# Patient Record
Sex: Male | Born: 1955 | Race: White | Hispanic: No | Marital: Married | Smoking: Current every day smoker
Health system: Southern US, Community
[De-identification: ages and names within clinical notes are randomized; demographics above are authoritative.]

## PROBLEM LIST (undated history)

## (undated) DIAGNOSIS — I639 Cerebral infarction, unspecified: Secondary | ICD-10-CM

## (undated) DIAGNOSIS — M1990 Unspecified osteoarthritis, unspecified site: Secondary | ICD-10-CM

## (undated) DIAGNOSIS — I675 Moyamoya disease: Secondary | ICD-10-CM

## (undated) DIAGNOSIS — G709 Myoneural disorder, unspecified: Secondary | ICD-10-CM

## (undated) DIAGNOSIS — T7840XA Allergy, unspecified, initial encounter: Secondary | ICD-10-CM

## (undated) HISTORY — DX: Myoneural disorder, unspecified: G70.9

## (undated) HISTORY — DX: Unspecified osteoarthritis, unspecified site: M19.90

## (undated) HISTORY — PX: CERVICAL DISC SURGERY: SHX588

## (undated) HISTORY — DX: Allergy, unspecified, initial encounter: T78.40XA

## (undated) HISTORY — DX: Cerebral infarction, unspecified: I63.9

## (undated) HISTORY — PX: VASECTOMY: SHX75

---

## 2002-08-01 ENCOUNTER — Ambulatory Visit (HOSPITAL_COMMUNITY): Admission: RE | Admit: 2002-08-01 | Discharge: 2002-08-01 | Payer: Self-pay | Admitting: Obstetrics and Gynecology

## 2002-08-01 ENCOUNTER — Encounter: Payer: Self-pay | Admitting: Obstetrics and Gynecology

## 2002-08-07 ENCOUNTER — Observation Stay (HOSPITAL_COMMUNITY): Admission: RE | Admit: 2002-08-07 | Discharge: 2002-08-08 | Payer: Self-pay | Admitting: Neurosurgery

## 2002-08-07 ENCOUNTER — Encounter: Payer: Self-pay | Admitting: Neurosurgery

## 2005-12-21 ENCOUNTER — Ambulatory Visit (HOSPITAL_COMMUNITY): Admission: RE | Admit: 2005-12-21 | Discharge: 2005-12-21 | Payer: Self-pay | Admitting: General Surgery

## 2005-12-26 ENCOUNTER — Ambulatory Visit (HOSPITAL_COMMUNITY): Admission: RE | Admit: 2005-12-26 | Discharge: 2005-12-26 | Payer: Self-pay | Admitting: General Surgery

## 2008-12-08 ENCOUNTER — Ambulatory Visit (HOSPITAL_COMMUNITY): Admission: RE | Admit: 2008-12-08 | Discharge: 2008-12-08 | Payer: Self-pay | Admitting: General Surgery

## 2009-05-18 ENCOUNTER — Ambulatory Visit (HOSPITAL_COMMUNITY): Admission: RE | Admit: 2009-05-18 | Discharge: 2009-05-18 | Payer: Self-pay | Admitting: General Surgery

## 2010-12-10 NOTE — H&P (Signed)
NAME:  James Marsh, James Marsh                       ACCOUNT NO.:  [id number]   MEDICAL RECORD NO.:  [id number]                   PATIENT TYPE:  INP   LOCATION:  3011                                 FACILITY:  [facility]   PHYSICIAN:  Hilda Lias, M.D.                DATE OF BIRTH:  1956-06-16   DATE OF ADMISSION:  08/07/2002  DATE OF DISCHARGE:                                HISTORY & PHYSICAL   HISTORY OF PRESENT ILLNESS:  The patient is a gentleman who was seen in my  office two days ago because, since February 2003, he had been having some  neck pain which was getting worse to the point that now it is going into the  left arm, he cannot sleep, has weakness and he is not able to work.  He is  quite miserable.  The patient denies any problems with the right upper  extremity.  This gentleman, in the past had __________ treatment without  improvement and now he is getting worse.  He states he had an MRI and was  sent to [place] for evaluation.   PAST MEDICAL HISTORY:  Negative for surgery.  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   ALLERGIES:  PENICILLIN.   CURRENT MEDICATIONS:  At the present time, he takes aspirin.   SOCIAL HISTORY:  The patient smokes cigarettes. He does not drink.   REVIEW OF SYMPTOMS:  Positive for neck and arm pain.   PHYSICAL EXAMINATION:  HEENT:  Normal.  NECK:  He came into my office with his left hand on top of the head.  He is  able to flex his head but extension causes pain.  LUNGS:  There are some rhonchi bilaterally.  HEART:  Heart sounds normal.  EXTREMITIES:  Normal with normal pulses.  NEUROLOGICAL:  Mental status is normal. Cranial nerves II-XII normal.  Strength is 5/5 except in the left triceps where he has 1/5 weakness with  hypertonia of the left pectoralis major.  Sensation:  He complains of some  frost bite in the left index finger.  Coordination is  normal.   LABORATORY DATA:  MRI showed that he has a herniated disc at the level of C6-  C7 centered to the left with extension laterally. He has a mild central disc  at the level C5-C6 and C6-C7.   IMPRESSION:  Left C6-C7 radiculopathy secondary to herniated disc with  lateral extension.  A small C5-C6 and C6-C7 spondylosis is near this.    PLAN:  The patient has been admitted for surgery.  Because of his moyamoya  and because it is lateral, we are going to use a posterior approach.  The  patient and I went over the risks which include infection, CSF leak,  bleeding because of his history of aspirin, more important was, however, a  stroke and all the damage from his cerebral infarction of the brain.  The  patient declined more opinion.                                               Hilda Lias, M.D.    EB/MEDQ  D:  08/07/2002  T:  08/07/2002  Job:  [id number]

## 2010-12-10 NOTE — Op Note (Signed)
NAME:  James Marsh, James Marsh                       ACCOUNT NO.:  [id number]   MEDICAL RECORD NO.:  [id number]                   PATIENT TYPE:  INP   LOCATION:  3011                                 FACILITY:  [facility]   PHYSICIAN:  Hilda Lias, M.D.                DATE OF BIRTH:  02-23-1956   DATE OF PROCEDURE:  08/08/2002  DATE OF DISCHARGE:                                 OPERATIVE REPORT   PREOPERATIVE DIAGNOSES:  1. C6-7 herniated disk with C7 radiculopathy.  2. 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  3. History of moyamoya disease.   POSTOPERATIVE DIAGNOSES:  1. C6-7 herniated disk with C7 radiculopathy.  2. 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  3. History of moyamoya disease.   PROCEDURE:  Left C5-6 diskectomy, sitting manner, using the Met-RX system, C-  arm, and microscope.   SURGEON:  Hilda Lias, M.D.   ASSISTANT:  Danae Orleans. Venetia Maxon, M.D.   CLINICAL HISTORY:  The patient is being admitted because of neck and left  upper extremity pain associated with a weakness of the triceps up to the  point that it is a 1/6 with atrophy of the pectoralis major.  X-ray showed  that he has a herniated disk laterally at the level of C6-7.  He also has  some changes of 5-6 and 4-5.  Because of the history of vascular disease of  the brain and because of the location of the disk, we decided to go a head  with a posterior approach.   DESCRIPTION OF PROCEDURE:  The patient was taken to the OR and after  intubation he was positioned in a sitting manner.  A Doppler was applied to  the chest wall.  The three-pin head holder was applied to the head.  The  neck was prepped with Betadine.  With the C-arm we localized the area  between C6-7.  The incision was made in the skin through the subcutaneous  tissue, and a dilator was inserted until we were able to be at the level of  the C6-7 laterally.  With the drill we drilled the lower lamina of C6, upper  of C7, and part of the facet.  The yellow ligament was also  excised.  Indeed, we found that the C7 nerve root was swollen and reddish and right at  the level of the axilla, there was a large disk.  The incision was done, and  about four free fragments of disk were removed.  Investigation superiorly,  inferiorly, and medially was negative.  Foraminotomy was accomplished.  Having done this, the area was irrigated and Depo-Medrol was left in the  dural space and the wound was closed with Vicryl and Steri-Strip.  Hilda Lias, M.D.    EB/MEDQ  D:  08/07/2002  T:  08/08/2002  Job:  [id number]

## 2015-08-27 DIAGNOSIS — D485 Neoplasm of uncertain behavior of skin: Secondary | ICD-10-CM | POA: Diagnosis not present

## 2015-08-27 DIAGNOSIS — D225 Melanocytic nevi of trunk: Secondary | ICD-10-CM | POA: Diagnosis not present

## 2015-08-27 DIAGNOSIS — D2260 Melanocytic nevi of unspecified upper limb, including shoulder: Secondary | ICD-10-CM | POA: Diagnosis not present

## 2015-08-27 DIAGNOSIS — L820 Inflamed seborrheic keratosis: Secondary | ICD-10-CM | POA: Diagnosis not present

## 2015-09-07 DIAGNOSIS — L219 Seborrheic dermatitis, unspecified: Secondary | ICD-10-CM | POA: Diagnosis not present

## 2016-08-17 DIAGNOSIS — B009 Herpesviral infection, unspecified: Secondary | ICD-10-CM | POA: Diagnosis not present

## 2016-08-17 DIAGNOSIS — D225 Melanocytic nevi of trunk: Secondary | ICD-10-CM | POA: Diagnosis not present

## 2017-01-09 ENCOUNTER — Ambulatory Visit (INDEPENDENT_AMBULATORY_CARE_PROVIDER_SITE_OTHER): Payer: Medicare Other | Admitting: Family Medicine

## 2017-01-09 ENCOUNTER — Encounter: Payer: Self-pay | Admitting: Family Medicine

## 2017-01-09 DIAGNOSIS — Z9109 Other allergy status, other than to drugs and biological substances: Secondary | ICD-10-CM | POA: Diagnosis not present

## 2017-01-09 DIAGNOSIS — Z8673 Personal history of transient ischemic attack (TIA), and cerebral infarction without residual deficits: Secondary | ICD-10-CM | POA: Diagnosis not present

## 2017-01-09 DIAGNOSIS — Z91018 Allergy to other foods: Secondary | ICD-10-CM | POA: Diagnosis not present

## 2017-01-09 DIAGNOSIS — K429 Umbilical hernia without obstruction or gangrene: Secondary | ICD-10-CM | POA: Diagnosis not present

## 2017-01-09 DIAGNOSIS — I675 Moyamoya disease: Secondary | ICD-10-CM | POA: Diagnosis not present

## 2017-01-09 DIAGNOSIS — M5000 Cervical disc disorder with myelopathy, unspecified cervical region: Secondary | ICD-10-CM | POA: Diagnosis not present

## 2017-01-09 DIAGNOSIS — R51 Headache: Secondary | ICD-10-CM

## 2017-01-09 DIAGNOSIS — R519 Headache, unspecified: Secondary | ICD-10-CM | POA: Insufficient documentation

## 2017-01-09 LAB — CBC
HEMATOCRIT: 40 % (ref 38.5–50.0)
Hemoglobin: 13.5 g/dL (ref 13.2–17.1)
MCH: 30 pg (ref 27.0–33.0)
MCHC: 33.8 g/dL (ref 32.0–36.0)
MCV: 88.9 fL (ref 80.0–100.0)
MPV: 10 fL (ref 7.5–12.5)
PLATELETS: 233 10*3/uL (ref 140–400)
RBC: 4.5 MIL/uL (ref 4.20–5.80)
RDW: 15.4 % — ABNORMAL HIGH (ref 11.0–15.0)
WBC: 6.2 10*3/uL (ref 3.8–10.8)

## 2017-01-09 LAB — COMPLETE METABOLIC PANEL WITH GFR
ALK PHOS: 86 U/L (ref 40–115)
ALT: 9 U/L (ref 9–46)
AST: 14 U/L (ref 10–35)
Albumin: 4.1 g/dL (ref 3.6–5.1)
BILIRUBIN TOTAL: 0.7 mg/dL (ref 0.2–1.2)
BUN: 17 mg/dL (ref 7–25)
CALCIUM: 9 mg/dL (ref 8.6–10.3)
CO2: 29 mmol/L (ref 20–31)
CREATININE: 1.03 mg/dL (ref 0.70–1.25)
Chloride: 104 mmol/L (ref 98–110)
GFR, EST NON AFRICAN AMERICAN: 77 mL/min (ref >=60)
Glucose, Bld: 80 mg/dL (ref 65–99)
Potassium: 4.4 mmol/L (ref 3.5–5.3)
Sodium: 141 mmol/L (ref 135–146)
TOTAL PROTEIN: 6.8 g/dL (ref 6.1–8.1)

## 2017-01-09 LAB — LIPID PANEL
CHOLESTEROL: 182 mg/dL (ref <200)
HDL: 43 mg/dL (ref >40)
LDL CALC: 125 mg/dL — AB (ref <100)
TRIGLYCERIDES: 70 mg/dL (ref <150)
Total CHOL/HDL Ratio: 4.2 Ratio (ref <5.0)
VLDL: 14 mg/dL (ref <30)

## 2017-01-09 NOTE — Progress Notes (Signed)
Chief Complaint  Patient presents with  . 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 Takes one aspirin every other day No other prescription medicines Has cervical and lumbar disc disease and had a cervical surgery for HNP Has muscle atrophy in right side and leg from "nerve damage" Has chronic daily headache he presumes is from his Moyamoya Has developed as an adult allergies to multiple foods including milk/cheese and nuts and ?wheat Also pollen allergies and current PND with ear pressure and cough Tries to eat well and exercise Smokes a pipe "to relax" No recent medical care or labs No colonoscopy - does not think it needed Discussed immunizations - which he generally avoids   Patient Active Problem List   Diagnosis Date Noted  . Moyamoya syndrome 01/09/2017  . Umbilical hernia 78/46/9629  . Cervical disc disease with myelopathy 01/09/2017  . History of completed stroke 01/09/2017  . Chronic daily headache 01/09/2017  . Environmental allergies 01/09/2017  . Food allergy 01/09/2017    Outpatient Encounter Prescriptions as of 01/09/2017  Medication Sig  . aspirin 325 MG EC tablet Take 325 mg by mouth daily. Takes every other day  . b complex vitamins tablet Take 1 tablet by mouth daily.  . vitamin C (ASCORBIC ACID) 500 MG tablet Take 500 mg by mouth daily.  . vitamin E 400 UNIT capsule Take 400 Units by mouth daily.  . [DISCONTINUED] aspirin EC 81 MG tablet Take 81 mg by mouth daily.   No facility-administered encounter medications on file as of 01/09/2017.     Past Medical History:  Diagnosis Date  . Allergy    pollen, foods  . Arthritis   . Neuromuscular disorder ([place])   . Stroke [facility])     Past Surgical History:  Procedure Laterality Date  . CERVICAL DISC SURGERY    . VASECTOMY      Social History   Social History  . Marital status: Married    Spouse name: N/A  . Number of children: N/A  . Years of  education: N/A   Occupational History  . Not on file.   Social History Main Topics  . Smoking status: Current Every Day Smoker    Types: Pipe    Start date: 07/25/1968  . Smokeless tobacco: Never Used     Comment: pipe smoker daily 3-4 times a day  . Alcohol use No  . Drug use: No  . Sexual activity: Not on file   Other Topics Concern  . Not on file   Social History Narrative  . No narrative on file    Family History  Problem Relation Age of Onset  . Cancer Mother 21       ovarian  . Heart disease Father   . Alcohol abuse Father   . Stroke Father   . Cancer Sister        brain  . Cancer Brother        brain  . Cancer Brother        brain  . Heart disease Brother     Review of Systems  Constitutional: Negative for chills, fever and weight loss.  HENT: Positive for congestion, ear pain and sinus pain. Negative for hearing loss.   Eyes: Negative for blurred vision, pain and redness.  Respiratory: Positive for cough. Negative for sputum production and shortness of breath.   Cardiovascular: Negative for chest pain, palpitations and leg swelling.  Gastrointestinal: Negative for abdominal pain, constipation, diarrhea and heartburn.  Genitourinary: Negative for dysuria and frequency.  Musculoskeletal: Positive for back pain and neck pain. Negative for falls, joint pain and myalgias.  Neurological: Positive for focal weakness and headaches. Negative for dizziness and seizures.  Endo/Heme/Allergies: Positive for environmental allergies. Bruises/bleeds easily.  Psychiatric/Behavioral: Negative for depression. The patient is not nervous/anxious and does not have insomnia.     BP 124/64 (BP Location: Right Arm, Patient Position: Sitting, Cuff Size: Normal)   Pulse 72   Temp 98.7 F (37.1 C) (Temporal)   Resp 18   Ht 5\' 6"  (1.676 m)   Wt 134 lb 0.6 oz (60.8 kg)   SpO2 98%   BMI 21.63 kg/m   Physical Exam  Constitutional: He is oriented to person, place, and time. He  appears well-developed and well-nourished.  thin  HENT:  Head: Normocephalic and atraumatic.  Mouth/Throat: Oropharynx is clear and moist.  Nose congested, clear drainage, both TMs are dull  Eyes: Conjunctivae are normal. Pupils are equal, round, and reactive to light.  Neck: Normal range of motion. Neck supple. No thyromegaly present.  Cardiovascular: Normal rate, regular rhythm and normal heart sounds.   Pulmonary/Chest: Effort normal and breath sounds normal. No respiratory distress.  Musculoskeletal: Normal range of motion. He exhibits no edema.  Right quad has atrophy  Lymphadenopathy:    He has cervical adenopathy.  Neurological: He is alert and oriented to person, place, and time.  Skin: Skin is warm and dry.  Psychiatric: He has a normal mood and affect. His behavior is normal. Thought content normal.  Mild emotional lability  Nursing note and vitals reviewed. ASSESSMENT/PLAN:   1. Moyamoya syndrome - CBC - COMPLETE METABOLIC PANEL WITH GFR - Lipid panel - Urinalysis, Routine w reflex microscopic  2. Umbilical hernia without obstruction and without gangrene  3. Cervical disc disease with myelopathy  4. History of completed stroke  5. Chronic daily headache  6. Environmental allergies Discussed OTC medicines for allergies  7. Food allergy avoid   Patient Instructions  Consider saline rinses of sinuses Cetirizine or loratadine for the allergies A steroid nasal spray may help (flonase or nasacort)  Labs ordered I will send you a letter with your test results.  If there is anything of concern, we will call right away.  See me yearly  Consider pneumonia and shingles shots These may be obtained at the pharmacy    Raylene Everts, MD

## 2017-01-09 NOTE — Patient Instructions (Signed)
Consider saline rinses of sinuses Cetirizine or loratadine for the allergies A steroid nasal spray may help (flonase or nasacort)  Labs ordered I will send you a letter with your test results.  If there is anything of concern, we will call right away.  See me yearly  Consider pneumonia and shingles shots These may be obtained at the pharmacy

## 2017-01-10 ENCOUNTER — Encounter: Payer: Self-pay | Admitting: Family Medicine

## 2017-01-10 LAB — URINALYSIS, ROUTINE W REFLEX MICROSCOPIC
BILIRUBIN URINE: NEGATIVE
GLUCOSE, UA: NEGATIVE
Hgb urine dipstick: NEGATIVE
LEUKOCYTES UA: NEGATIVE
Nitrite: NEGATIVE
PH: 6 (ref 5.0–8.0)
PROTEIN: NEGATIVE
SPECIFIC GRAVITY, URINE: 1.025 (ref 1.001–1.035)

## 2017-01-12 ENCOUNTER — Telehealth: Payer: Self-pay | Admitting: Family Medicine

## 2017-01-12 DIAGNOSIS — I675 Moyamoya disease: Secondary | ICD-10-CM

## 2017-01-12 NOTE — Telephone Encounter (Signed)
done

## 2017-01-12 NOTE — Telephone Encounter (Signed)
Cb#: [phone] (wife- linda santell )   Patients wife called in stating patient told her he was experiencing numbness around his mouth, in hands, and on the left side of his leg (didnt specify which leg) She says he has a rare medical issue that requires a lot of attention, so she is requesting an mri (if dr.nelson agrees that maybe its needed for these symptoms) Also a referral to [place] at [facility] Please call her back to discuss.

## 2017-01-12 NOTE — Telephone Encounter (Signed)
Left message to call office

## 2017-01-12 NOTE — Telephone Encounter (Signed)
Spoke to [employer], she states she does not feel he is having stroke sx, but is having left arm numbness and lip numbness. Does not feel he needs to go to ER, just want referral to neurology.

## 2017-01-12 NOTE — Telephone Encounter (Signed)
Can refer to neurologist.  Cannot order a MRI and get urgently.  If he is having acute symptoms that even remotely could be a stroke he needs to go to an ER for urgent testing.

## 2017-01-13 ENCOUNTER — Telehealth: Payer: Self-pay

## 2017-01-13 NOTE — Telephone Encounter (Signed)
Please call regarding lab results - please call home # [phone]

## 2017-01-16 NOTE — Telephone Encounter (Signed)
Spoke with pt about labs, letter also mailed.

## 2017-01-18 ENCOUNTER — Emergency Department (HOSPITAL_COMMUNITY): Payer: Medicare Other

## 2017-01-18 ENCOUNTER — Encounter (HOSPITAL_COMMUNITY): Payer: Self-pay | Admitting: Emergency Medicine

## 2017-01-18 ENCOUNTER — Inpatient Hospital Stay (HOSPITAL_COMMUNITY)
Admission: EM | Admit: 2017-01-18 | Discharge: 2017-01-20 | DRG: 065 | Disposition: A | Discharge status: Home / Self Care | Payer: Medicare Other | Attending: Internal Medicine | Admitting: Internal Medicine

## 2017-01-18 DIAGNOSIS — I6522 Occlusion and stenosis of left carotid artery: Secondary | ICD-10-CM | POA: Diagnosis not present

## 2017-01-18 DIAGNOSIS — Z8673 Personal history of transient ischemic attack (TIA), and cerebral infarction without residual deficits: Secondary | ICD-10-CM | POA: Diagnosis not present

## 2017-01-18 DIAGNOSIS — I10 Essential (primary) hypertension: Secondary | ICD-10-CM | POA: Diagnosis present

## 2017-01-18 DIAGNOSIS — I639 Cerebral infarction, unspecified: Principal | ICD-10-CM | POA: Diagnosis present

## 2017-01-18 DIAGNOSIS — Z8249 Family history of ischemic heart disease and other diseases of the circulatory system: Secondary | ICD-10-CM

## 2017-01-18 DIAGNOSIS — E785 Hyperlipidemia, unspecified: Secondary | ICD-10-CM | POA: Diagnosis present

## 2017-01-18 DIAGNOSIS — R51 Headache: Secondary | ICD-10-CM

## 2017-01-18 DIAGNOSIS — Z880 Allergy status to penicillin: Secondary | ICD-10-CM

## 2017-01-18 DIAGNOSIS — F17290 Nicotine dependence, other tobacco product, uncomplicated: Secondary | ICD-10-CM | POA: Diagnosis present

## 2017-01-18 DIAGNOSIS — R200 Anesthesia of skin: Secondary | ICD-10-CM | POA: Diagnosis not present

## 2017-01-18 DIAGNOSIS — I651 Occlusion and stenosis of basilar artery: Secondary | ICD-10-CM | POA: Diagnosis present

## 2017-01-18 DIAGNOSIS — I675 Moyamoya disease: Secondary | ICD-10-CM | POA: Diagnosis present

## 2017-01-18 DIAGNOSIS — Z7982 Long term (current) use of aspirin: Secondary | ICD-10-CM

## 2017-01-18 DIAGNOSIS — R519 Headache, unspecified: Secondary | ICD-10-CM | POA: Diagnosis present

## 2017-01-18 DIAGNOSIS — Z823 Family history of stroke: Secondary | ICD-10-CM

## 2017-01-18 DIAGNOSIS — Z981 Arthrodesis status: Secondary | ICD-10-CM

## 2017-01-18 DIAGNOSIS — Z79899 Other long term (current) drug therapy: Secondary | ICD-10-CM

## 2017-01-18 HISTORY — DX: Cerebral infarction, unspecified: I63.9

## 2017-01-18 HISTORY — DX: Moyamoya disease: I67.5

## 2017-01-18 LAB — DIFFERENTIAL
BASOS PCT: 0 %
Basophils Absolute: 0 10*3/uL (ref 0.0–0.1)
EOS ABS: 0.1 10*3/uL (ref 0.0–0.7)
Eosinophils Relative: 3 %
Lymphocytes Relative: 39 %
Lymphs Abs: 2.2 10*3/uL (ref 0.7–4.0)
MONO ABS: 0.3 10*3/uL (ref 0.1–1.0)
MONOS PCT: 6 %
NEUTROS ABS: 3 10*3/uL (ref 1.7–7.7)
Neutrophils Relative %: 52 %

## 2017-01-18 LAB — COMPREHENSIVE METABOLIC PANEL
ALT: 13 U/L — AB (ref 17–63)
AST: 18 U/L (ref 15–41)
Albumin: 4.3 g/dL (ref 3.5–5.0)
Alkaline Phosphatase: 81 U/L (ref 38–126)
Anion gap: 11 (ref 5–15)
BILIRUBIN TOTAL: 0.7 mg/dL (ref 0.3–1.2)
BUN: 15 mg/dL (ref 6–20)
CHLORIDE: 100 mmol/L — AB (ref 101–111)
CO2: 27 mmol/L (ref 22–32)
CREATININE: 0.97 mg/dL (ref 0.61–1.24)
Calcium: 9.1 mg/dL (ref 8.9–10.3)
Glucose, Bld: 85 mg/dL (ref 65–99)
Potassium: 3.8 mmol/L (ref 3.5–5.1)
Sodium: 138 mmol/L (ref 135–145)
TOTAL PROTEIN: 7.6 g/dL (ref 6.5–8.1)

## 2017-01-18 LAB — PROTIME-INR
INR: 1.02
Prothrombin Time: 13.4 seconds (ref 11.4–15.2)

## 2017-01-18 LAB — RAPID URINE DRUG SCREEN, HOSP PERFORMED
Amphetamines: NOT DETECTED
BENZODIAZEPINES: NOT DETECTED
Barbiturates: NOT DETECTED
Cocaine: NOT DETECTED
Opiates: NOT DETECTED
Tetrahydrocannabinol: NOT DETECTED

## 2017-01-18 LAB — CBC
HEMATOCRIT: 42.6 % (ref 39.0–52.0)
Hemoglobin: 14.4 g/dL (ref 13.0–17.0)
MCH: 30.1 pg (ref 26.0–34.0)
MCHC: 33.8 g/dL (ref 30.0–36.0)
MCV: 89.1 fL (ref 78.0–100.0)
Platelets: 242 10*3/uL (ref 150–400)
RBC: 4.78 MIL/uL (ref 4.22–5.81)
RDW: 13.9 % (ref 11.5–15.5)
WBC: 5.7 10*3/uL (ref 4.0–10.5)

## 2017-01-18 LAB — I-STAT CHEM 8, ED
BUN: 15 mg/dL (ref 6–20)
CALCIUM ION: 1.15 mmol/L (ref 1.15–1.40)
Chloride: 103 mmol/L (ref 101–111)
Creatinine, Ser: 1 mg/dL (ref 0.61–1.24)
GLUCOSE: 82 mg/dL (ref 65–99)
HCT: 36 % — ABNORMAL LOW (ref 39.0–52.0)
HEMOGLOBIN: 12.2 g/dL — AB (ref 13.0–17.0)
Potassium: 3.9 mmol/L (ref 3.5–5.1)
Sodium: 141 mmol/L (ref 135–145)
TCO2: 27 mmol/L (ref 0–100)

## 2017-01-18 LAB — I-STAT TROPONIN, ED: TROPONIN I, POC: 0.01 ng/mL (ref 0.00–0.08)

## 2017-01-18 LAB — URINALYSIS, ROUTINE W REFLEX MICROSCOPIC
Bilirubin Urine: NEGATIVE
Glucose, UA: NEGATIVE mg/dL
HGB URINE DIPSTICK: NEGATIVE
Ketones, ur: NEGATIVE mg/dL
LEUKOCYTES UA: NEGATIVE
NITRITE: NEGATIVE
PROTEIN: NEGATIVE mg/dL
SPECIFIC GRAVITY, URINE: 1.009 (ref 1.005–1.030)
pH: 7 (ref 5.0–8.0)

## 2017-01-18 LAB — APTT: aPTT: 34 seconds (ref 24–36)

## 2017-01-18 LAB — ETHANOL

## 2017-01-18 MED ORDER — ACETAMINOPHEN 650 MG RE SUPP: 650.0000 mg | RECTAL | Status: DC | PRN

## 2017-01-18 MED ORDER — B COMPLEX-C PO TABS
1.0000 | ORAL_TABLET | Freq: Every day | ORAL | Status: DC
  Filled 2017-01-18: qty 1

## 2017-01-18 MED ORDER — ASPIRIN 300 MG RE SUPP
300.0000 mg | Freq: Every day | RECTAL | Status: DC
  Filled 2017-01-18: qty 1

## 2017-01-18 MED ORDER — ACETAMINOPHEN 160 MG/5ML PO SOLN: 650.0000 mg | ORAL | Status: DC | PRN

## 2017-01-18 MED ORDER — ACETAMINOPHEN 325 MG PO TABS: 650.0000 mg | ORAL_TABLET | ORAL | Status: DC | PRN

## 2017-01-18 MED ORDER — STROKE: EARLY STAGES OF RECOVERY BOOK
Freq: Once | Status: DC
  Filled 2017-01-18: qty 1

## 2017-01-18 MED ORDER — SODIUM CHLORIDE 0.9 % IV SOLN
INTRAVENOUS | Status: DC
  Administered 2017-01-19: 05:00:00 via INTRAVENOUS

## 2017-01-18 MED ORDER — FLUTICASONE PROPIONATE 50 MCG/ACT NA SUSP: 1.0000 | Freq: Every day | NASAL | Status: DC | PRN

## 2017-01-18 MED ORDER — ENOXAPARIN SODIUM 40 MG/0.4ML ~~LOC~~ SOLN
40.0000 mg | SUBCUTANEOUS | Status: DC
  Filled 2017-01-18: qty 0.4

## 2017-01-18 MED ORDER — ASPIRIN 325 MG PO TABS
325.0000 mg | ORAL_TABLET | Freq: Every day | ORAL | Status: DC
  Administered 2017-01-19 – 2017-01-20 (×2): 325 mg via ORAL
  Filled 2017-01-18 (×2): qty 1

## 2017-01-18 MED ORDER — VITAMIN C 500 MG PO TABS
500.0000 mg | ORAL_TABLET | Freq: Every day | ORAL | Status: DC
  Filled 2017-01-18: qty 1

## 2017-01-18 MED ORDER — SENNOSIDES-DOCUSATE SODIUM 8.6-50 MG PO TABS
1.0000 | ORAL_TABLET | Freq: Every evening | ORAL | Status: DC | PRN
Start: 2017-01-18 — End: 2017-01-20

## 2017-01-18 NOTE — ED Triage Notes (Signed)
Pt a/o. States has had several episodes of tingling and numbess around mouth and left arm. States will last approx few minutes, will go away and come back later. C/o headache. Steady gait.

## 2017-01-18 NOTE — ED Notes (Signed)
Pt originally reported that he was not having any symptoms at time of registration to nurse first and during start of triage. Pt then reported numbness was starting around his lips while triage RN was performing NIH. Pt denied any unilateral weakness or numbness at time of triage. Strength equal bilaterally, no arm droop, face symmetrical. Dr. Oleta Mouse notified of pt's symptoms. No Code Stroke at this time. Will continue to monitor. Pt placed in waiting area behind triage room.

## 2017-01-18 NOTE — H&P (Signed)
History and Physical    James Marsh HGD:[id number] DOB: 25-Nov-1955 DOA: 01/18/2017  PCP: Raylene Everts, MD  Patient coming from: Home.   Chief Complaint:  Numbness of left upper extremity and circumoral paresthesia.   HPI: James Marsh is an 61 y.o. male with hx of Moya moya disease, diagnosed with both MRA and cerebral arteriogram in the 90's, hx of on going pipe tobacco use, complete occlusion of the left ICA, chronic daily HA, hx of cervical disk disease, prior CVA, presented to the ER with several day hx of intermittent circumoral paresthesia and left upper ext weakness.  While head CT was negative, MRI of the brain suggest acute CVAs, with several area on both hemispheres.  See MRI report.  Serology was unremarkable, and EKG showed NSR.     ED Course:  See above.  Rewiew of Systems:  Constitutional: Negative for malaise, fever and chills. No significant weight loss or weight gain Eyes: Negative for eye pain, redness and discharge, diplopia, visual changes, or flashes of light. ENMT: Negative for ear pain, hoarseness, nasal congestion, sinus pressure and sore throat. No headaches; tinnitus, drooling, or problem swallowing. Cardiovascular: Negative for chest pain, palpitations, diaphoresis, dyspnea and peripheral edema. ; No orthopnea, PND Respiratory: Negative for cough, hemoptysis, wheezing and stridor. No pleuritic chestpain. Gastrointestinal: Negative for diarrhea, constipation,  melena, blood in stool, hematemesis, jaundice and rectal bleeding.    Genitourinary: Negative for frequency, dysuria, incontinence,flank pain and hematuria; Musculoskeletal: Negative for back pain and neck pain. Negative for swelling and trauma.;  Skin: . Negative for pruritus, rash, abrasions, bruising and skin lesion.; ulcerations Neuro: Negative for headache, lightheadedness and neck stiffness. Negative for weakness, altered level of consciousness , altered mental status, extremity weakness,  burning feet, involuntary movement, seizure and syncope.  Psych: negative for anxiety, depression, insomnia, tearfulness, panic attacks, hallucinations, paranoia, suicidal or homicidal ideation    Past Medical History:  Diagnosis Date  . Allergy    pollen, foods  . Arthritis   . Moya moya disease   . Neuromuscular disorder ([place])   . Stroke [facility])    Past Surgical History:  Procedure Laterality Date  . CERVICAL DISC SURGERY    . VASECTOMY       reports that he has been smoking Pipe.  He started smoking about 48 years ago. He has never used smokeless tobacco. He reports that he does not drink alcohol or use drugs.  Allergies  Allergen Reactions  . Penicillins Shortness Of Breath    Has patient had a PCN reaction causing immediate rash, facial/tongue/throat swelling, SOB or lightheadedness with hypotension: yes Has patient had a PCN reaction causing severe rash involving mucus membranes or skin necrosis: no Has patient had a PCN reaction that required hospitalization: yes Has patient had a PCN reaction occurring within the last 10 years: no If all of the above answers are "NO", then may proceed with Cephalosporin use.     Family History  Problem Relation Age of Onset  . Cancer Mother 29       ovarian  . Heart disease Father   . Alcohol abuse Father   . Stroke Father   . Cancer Sister        brain  . Cancer Brother        brain  . Cancer Brother        brain  . Heart disease Brother      Prior to Admission medications   Medication Sig Start Date End  Date Taking? Authorizing Provider  aspirin 325 MG EC tablet Take 325 mg by mouth every other day.    Yes [provider]  b complex vitamins tablet Take 1 tablet by mouth daily.   Yes [provider]  fluticasone (FLONASE) 50 MCG/ACT nasal spray Place 1 spray into both nostrils daily as needed for allergies or rhinitis.   Yes [provider]  vitamin C (ASCORBIC ACID) 500 MG tablet Take 500 mg by  mouth daily.   Yes [provider]  vitamin E 400 UNIT capsule Take 400 Units by mouth daily.   Yes [provider]    Physical Exam: Vitals:   01/18/17 1800 01/18/17 1830 01/18/17 1900 01/18/17 2131  BP: (!) 150/77 (!) 154/84  (!) 154/80  Pulse: 63 (!) 56  63  Resp:  18  17  Temp:   97.8 F (36.6 C)   TempSrc:      SpO2: 99% 98%  97%  Weight:      Height:       Constitutional: NAD, calm, comfortable Vitals:   01/18/17 1800 01/18/17 1830 01/18/17 1900 01/18/17 2131  BP: (!) 150/77 (!) 154/84  (!) 154/80  Pulse: 63 (!) 56  63  Resp:  18  17  Temp:   97.8 F (36.6 C)   TempSrc:      SpO2: 99% 98%  97%  Weight:      Height:       Eyes: PERRL, lids and conjunctivae normal ENMT: Mucous membranes are moist. Posterior pharynx clear of any exudate or lesions.Normal dentition.  Neck: normal, supple, no masses, no thyromegaly Respiratory: clear to auscultation bilaterally, no wheezing, no crackles. Normal respiratory effort. No accessory muscle use.  Cardiovascular: Regular rate and rhythm, no murmurs / rubs / gallops. No extremity edema. 2+ pedal pulses. No carotid bruits.  Abdomen: no tenderness, no masses palpated. No hepatosplenomegaly. Bowel sounds positive.  Musculoskeletal: no clubbing / cyanosis. No joint deformity upper and lower extremities. Good ROM, no contractures. Normal muscle tone.  Skin: no rashes, lesions, ulcers. No induration Neurologic: CN 2-12 grossly intact. Sensation intact, DTR normal. Right lower extremity is 4-5, not new.  Psychiatric: Normal judgment and insight. Alert and oriented x 3. Normal mood.    Labs on Admission: I have personally reviewed following labs and imaging studies CBC:  Recent Labs Lab 01/18/17 1756 01/18/17 1843  WBC 5.7  --   NEUTROABS 3.0  --   HGB 14.4 12.2*  HCT 42.6 36.0*  MCV 89.1  --   PLT 242  --    Basic Metabolic Panel:  Recent Labs Lab 01/18/17 1756 01/18/17 1843  NA 138 141  K 3.8 3.9    CL 100* 103  CO2 27  --   GLUCOSE 85 82  BUN 15 15  CREATININE 0.97 1.00  CALCIUM 9.1  --    GFR: Estimated Creatinine Clearance: 65.9 mL/min (by C-G formula based on SCr of 1 mg/dL). Liver Function Tests:  Recent Labs Lab 01/18/17 1756  AST 18  ALT 13*  ALKPHOS 81  BILITOT 0.7  PROT 7.6  ALBUMIN 4.3   Coagulation Profile:  Recent Labs Lab 01/18/17 1756  INR 1.02   Urine analysis:    Component Value Date/Time   COLORURINE YELLOW 01/18/2017 1756   APPEARANCEUR CLEAR 01/18/2017 1756   LABSPEC 1.009 01/18/2017 1756   PHURINE 7.0 01/18/2017 1756   GLUCOSEU NEGATIVE 01/18/2017 1756   HGBUR NEGATIVE 01/18/2017 1756   BILIRUBINUR NEGATIVE  01/18/2017 1756   [place] 01/18/2017 1756   PROTEINUR NEGATIVE 01/18/2017 1756   NITRITE NEGATIVE 01/18/2017 1756   LEUKOCYTESUR NEGATIVE 01/18/2017 1756   Radiological Exams on Admission: Ct Head Wo Contrast  Result Date: 01/18/2017 CLINICAL DATA:  Several episodes of tingling and numbness around mouth for 3-4 days. Headache. EXAM: CT HEAD WITHOUT CONTRAST TECHNIQUE: Contiguous axial images were obtained from the base of the skull through the vertex without intravenous contrast. COMPARISON:  None. FINDINGS: Brain: No evidence of acute infarction, hemorrhage, hydrocephalus, extra-axial collection or mass lesion/mass effect. Mega cisterna magna. Normal cerebral volume. No white matter disease. Vascular: Calcification of the cavernous internal carotid arteries and distal vertebral arteries consistent with cerebrovascular atherosclerotic disease. No signs of intracranial large vessel occlusion. Skull: Normal. Negative for fracture or focal lesion. Sinuses/Orbits: No acute finding. Other: Small BILATERAL mastoid effusions. No nasopharyngeal process is evident. IMPRESSION: Negative exam.  No cause seen for the reported symptoms. Electronically Signed   By: Staci Righter M.D.   On: 01/18/2017 19:03   Mr Brain Wo Contrast (neuro  Protocol)  Result Date: 01/18/2017 CLINICAL DATA:  Perioral numbness. This began earlier today. In the patient's Epic chart, history is given of Moyamoya syndrome with previous TIA and stroke. EXAM: MRI HEAD WITHOUT CONTRAST TECHNIQUE: Multiplanar, multiecho pulse sequences of the brain and surrounding structures were obtained without intravenous contrast. COMPARISON:  CT head was negative. FINDINGS: Brain: There is at least one 2 mm focus of restricted diffusion, LEFT mid pons, consistent with acute infarction (4/73). There may be a second 2 mm area of acute infarction, RIGHT paramedian midbrain, (4/77), an area which is more closely correlated with perioral numbness. Finally, there is a possible third 2 mm area of acute infarction, LEFT posterior occipital cortex, (4/77). No hemorrhage, mass lesion, hydrocephalus, or extra-axial fluid. Normal cerebral volume. Mega cisterna magna. Mild subcortical and periventricular T2 and FLAIR hyperintensities, likely chronic microvascular ischemic change. There is an old LEFT caudate and punctate LEFT putaminal lacunar infarct. Vascular: The LEFT internal carotid artery is occluded based on flow void, in its skull base and supraclinoid segments. The RIGHT internal carotid artery appears patent and hypertrophied. Both anterior and middle cerebral arteries demonstrate normal flow voids. The basilar artery appears small but patent. In its midportion, the basilar appears thick-walled. It is unclear if the RIGHT vertebral is patent, based on lower most images to the foramen magnum. The LEFT vertebral is patent based on flow void. Skull and upper cervical spine: Normal marrow signal. Sinuses/Orbits: Negative orbits.  Minor paranasal sinus disease. Other: BILATERAL mastoid effusions. Compared with recent CT, the areas of infarction on MR are not visible. IMPRESSION: At least one small focus of acute nonhemorrhagic infarction can be confirmed, LEFT mid pons. There may be other  punctate foci of acute ischemia affecting the RIGHT midbrain and LEFT occipital lobe. Mild atrophy and small vessel disease. Apparent chronic occlusion of the LEFT ICA. The basilar appears chronically diseased, and the RIGHT vertebral may occluded. BILATERAL mastoid effusions, uncertain significance. Electronically Signed   By: Staci Righter M.D.   On: 01/18/2017 20:27    EKG: Independently reviewed.   Assessment/Plan Active Problems:   Moyamoya syndrome   History of completed stroke   Chronic daily headache   Acute CVA (cerebrovascular accident) ([place])   PLAN:   Acute CVA's in patient with prior diagnosis of Moya moya disease:  Not much invasive intervention can be offered.  I suggest that he takes DUAT, but he would  like to stay with ASA alone at this time.  Will admit for OBS, and obtain carotid along with ECHO.  Will obtain MRA of the brain as well.   Get lipid profile in consideration of risk lowering strategy, though he expressed that he is very much against any pharmaceutical, medical or interventional therapy. Will need to r/out embolic phenomenon as well with multiple bilateral punctate CVAs.  Have requested for neurology consultation in [place].   Tobacco abuse:  He said he uses it for his HA.  Advised stop as it will increase his risk for further CVA.     HTN:  Will give permissive.  Prior BPs has not been elevated.   DVT prophylaxis: lovenox.  Code Status:  FULL CODE.  Family Communication:  Wife at bedside.  Disposition Plan: Home.  Consults called: None.  Admission status: OBS.    Aryella Besecker MD FACP. [facility]  If 7PM-7AM, please contact night-coverage [email or web address] Password [facility]  01/18/2017, 10:02 PM

## 2017-01-18 NOTE — ED Notes (Signed)
Hospitalist at Bedside 

## 2017-01-18 NOTE — ED Notes (Signed)
Pt denies any symptoms at this time

## 2017-01-18 NOTE — ED Provider Notes (Addendum)
[facility] DEPT Provider Note   CSN: [id number] Arrival date & time: 01/18/17  1444     History   Chief Complaint Chief Complaint  Patient presents with  . Numbness    HPI James Marsh is a 61 y.o. male.   Patient has had several episodes of tingling and numbness around the mouth and some to the left arm. States that the last for a few minutes and then come back later. This is been ongoing for a few days. Also associated with some headache. Patient with a diagnosis of moyamoya disease back in 2005. Had a stroke at that time period and was told that he had a complete occlusion of his left carotid artery. Patient hasn't had much follow-up since then. No nausea no vomiting no visual changes.  Patient did have a full aspirin today.      Past Medical History:  Diagnosis Date  . Allergy    pollen, foods  . Arthritis   . Moya moya disease   . Neuromuscular disorder ([place])   . Stroke [facility])     Patient Active Problem List   Diagnosis Date Noted  . Moyamoya syndrome 01/09/2017  . Umbilical hernia 93/79/0240  . Cervical disc disease with myelopathy 01/09/2017  . History of completed stroke 01/09/2017  . Chronic daily headache 01/09/2017  . Environmental allergies 01/09/2017  . Food allergy 01/09/2017    Past Surgical History:  Procedure Laterality Date  . CERVICAL DISC SURGERY    . VASECTOMY         Home Medications    Prior to Admission medications   Medication Sig Start Date End Date Taking? Authorizing Provider  aspirin 325 MG EC tablet Take 325 mg by mouth every other day.    Yes [provider]  b complex vitamins tablet Take 1 tablet by mouth daily.   Yes [provider]  fluticasone (FLONASE) 50 MCG/ACT nasal spray Place 1 spray into both nostrils daily as needed for allergies or rhinitis.   Yes [provider]  vitamin C (ASCORBIC ACID) 500 MG tablet Take 500 mg by mouth daily.   Yes [provider]  vitamin E 400  UNIT capsule Take 400 Units by mouth daily.   Yes [provider]    Family History Family History  Problem Relation Age of Onset  . Cancer Mother 26       ovarian  . Heart disease Father   . Alcohol abuse Father   . Stroke Father   . Cancer Sister        brain  . Cancer Brother        brain  . Cancer Brother        brain  . Heart disease Brother     Social History Social History  Substance Use Topics  . Smoking status: Current Every Day Smoker    Types: Pipe    Start date: 07/25/1968  . Smokeless tobacco: Never Used     Comment: pipe smoker daily 3-4 times a day  . Alcohol use No     Allergies   Penicillins   Review of Systems Review of Systems  Constitutional: Negative for fever.  HENT: Negative for congestion.   Eyes: Negative for photophobia and visual disturbance.  Respiratory: Negative for shortness of breath.   Cardiovascular: Negative for chest pain.  Gastrointestinal: Negative for abdominal pain.  Genitourinary: Negative for dysuria.  Musculoskeletal: Negative for back pain and neck pain.  Skin: Negative for rash.  Neurological: Positive for numbness and headaches. Negative for dizziness, seizures, syncope, facial asymmetry, speech difficulty, weakness and light-headedness.  Hematological: Does not bruise/bleed easily.  Psychiatric/Behavioral: Negative for confusion.     Physical Exam Updated Vital Signs BP (!) 154/84   Pulse (!) 56   Temp 97.8 F (36.6 C)   Resp 18   Ht 1.676 m (5\' 6" )   Wt 60.8 kg (134 lb)   SpO2 98%   BMI 21.63 kg/m   Physical Exam  Constitutional: He is oriented to person, place, and time. He appears well-developed and well-nourished. No distress.  HENT:  Head: Normocephalic and atraumatic.  Mouth/Throat: Oropharynx is clear and moist.  Eyes: Conjunctivae and EOM are normal. Pupils are equal, round, and reactive to light.  Neck: Normal range of motion. Neck supple.  Cardiovascular: Normal rate, regular  rhythm and normal heart sounds.   Pulmonary/Chest: Effort normal and breath sounds normal.  Abdominal: Soft. Bowel sounds are normal. There is no tenderness.  Musculoskeletal: Normal range of motion. He exhibits no edema.  Neurological: He is alert and oriented to person, place, and time. No cranial nerve deficit or sensory deficit. He exhibits normal muscle tone. Coordination normal.  Skin: No rash noted.  Nursing note and vitals reviewed.    ED Treatments / Results  Labs (all labs ordered are listed, but only abnormal results are displayed) Labs Reviewed  COMPREHENSIVE METABOLIC PANEL - Abnormal; Notable for the following:       Result Value   Chloride 100 (*)    ALT 13 (*)    All other components within normal limits  I-STAT CHEM 8, ED - Abnormal; Notable for the following:    Hemoglobin 12.2 (*)    HCT 36.0 (*)    All other components within normal limits  ETHANOL  PROTIME-INR  APTT  CBC  DIFFERENTIAL  RAPID URINE DRUG SCREEN, HOSP PERFORMED  URINALYSIS, ROUTINE W REFLEX MICROSCOPIC  I-STAT TROPOININ, ED    EKG  EKG Interpretation  Date/Time:  Wednesday January 18 2017 18:21:15 EDT Ventricular Rate:  60 PR Interval:    QRS Duration: 98 QT Interval:  478 QTC Calculation: 478 R Axis:   -54 Text Interpretation:  Sinus rhythm Left anterior fascicular block Probable anteroseptal infarct, old Confirmed by Fredia Sorrow [phone]) on 01/18/2017 6:29:15 PM       Radiology Ct Head Wo Contrast  Result Date: 01/18/2017 CLINICAL DATA:  Several episodes of tingling and numbness around mouth for 3-4 days. Headache. EXAM: CT HEAD WITHOUT CONTRAST TECHNIQUE: Contiguous axial images were obtained from the base of the skull through the vertex without intravenous contrast. COMPARISON:  None. FINDINGS: Brain: No evidence of acute infarction, hemorrhage, hydrocephalus, extra-axial collection or mass lesion/mass effect. Mega cisterna magna. Normal cerebral volume. No white matter  disease. Vascular: Calcification of the cavernous internal carotid arteries and distal vertebral arteries consistent with cerebrovascular atherosclerotic disease. No signs of intracranial large vessel occlusion. Skull: Normal. Negative for fracture or focal lesion. Sinuses/Orbits: No acute finding. Other: Small BILATERAL mastoid effusions. No nasopharyngeal process is evident. IMPRESSION: Negative exam.  No cause seen for the reported symptoms. Electronically Signed   By: Staci Righter M.D.   On: 01/18/2017 19:03   Mr Brain Wo Contrast (neuro Protocol)  Result Date: 01/18/2017 CLINICAL DATA:  Perioral numbness. This began earlier today. In the patient's Epic chart, history is given of Moyamoya syndrome with previous TIA and stroke. EXAM: MRI HEAD WITHOUT CONTRAST TECHNIQUE: Multiplanar, multiecho pulse sequences of the brain  and surrounding structures were obtained without intravenous contrast. COMPARISON:  CT head was negative. FINDINGS: Brain: There is at least one 2 mm focus of restricted diffusion, LEFT mid pons, consistent with acute infarction (4/73). There may be a second 2 mm area of acute infarction, RIGHT paramedian midbrain, (4/77), an area which is more closely correlated with perioral numbness. Finally, there is a possible third 2 mm area of acute infarction, LEFT posterior occipital cortex, (4/77). No hemorrhage, mass lesion, hydrocephalus, or extra-axial fluid. Normal cerebral volume. Mega cisterna magna. Mild subcortical and periventricular T2 and FLAIR hyperintensities, likely chronic microvascular ischemic change. There is an old LEFT caudate and punctate LEFT putaminal lacunar infarct. Vascular: The LEFT internal carotid artery is occluded based on flow void, in its skull base and supraclinoid segments. The RIGHT internal carotid artery appears patent and hypertrophied. Both anterior and middle cerebral arteries demonstrate normal flow voids. The basilar artery appears small but patent. In its  midportion, the basilar appears thick-walled. It is unclear if the RIGHT vertebral is patent, based on lower most images to the foramen magnum. The LEFT vertebral is patent based on flow void. Skull and upper cervical spine: Normal marrow signal. Sinuses/Orbits: Negative orbits.  Minor paranasal sinus disease. Other: BILATERAL mastoid effusions. Compared with recent CT, the areas of infarction on MR are not visible. IMPRESSION: At least one small focus of acute nonhemorrhagic infarction can be confirmed, LEFT mid pons. There may be other punctate foci of acute ischemia affecting the RIGHT midbrain and LEFT occipital lobe. Mild atrophy and small vessel disease. Apparent chronic occlusion of the LEFT ICA. The basilar appears chronically diseased, and the RIGHT vertebral may occluded. BILATERAL mastoid effusions, uncertain significance. Electronically Signed   By: Staci Righter M.D.   On: 01/18/2017 20:27    Procedures Procedures (including critical care time)  CRITICAL CARE Performed by: Fredia Sorrow Total critical care time: 30 minutes Critical care time was exclusive of separately billable procedures and treating other patients. Critical care was necessary to treat or prevent imminent or life-threatening deterioration. Critical care was time spent personally by me on the following activities: development of treatment plan with patient and/or surrogate as well as nursing, discussions with consultants, evaluation of patient's response to treatment, examination of patient, obtaining history from patient or surrogate, ordering and performing treatments and interventions, ordering and review of laboratory studies, ordering and review of radiographic studies, pulse oximetry and re-evaluation of patient's condition.   Medications Ordered in ED Medications - No data to display   Initial Impression / Assessment and Plan / ED Course  I have reviewed the triage vital signs and the nursing  notes.  Pertinent labs & imaging results that were available during my care of the patient were reviewed by me and considered in my medical decision making (see chart for details).       Since patient's symptoms were intermittent. Was not a candidate for TPA. But patient was treated with code stroke orders. Formal code stroke not called.   Head CT was negative. But MRI showed evidence of an acute infarct. There was at least one small focus of an acute nonhemorrhagic infarction confirmed left mid pons. There may be other punctate foci of acute ischemia affecting the right midbrain and left occipital lobe. Patient has a occlusion of the left ICA and possibly right vertebral artery occlusion as well.   Discussed with the hospitalist he will be admitted for an acute  CVA.   Final Clinical Impressions(s) / ED Diagnoses  Final diagnoses:  Cerebrovascular accident (CVA), unspecified mechanism [facility])    New Prescriptions New Prescriptions   No medications on file     Fredia Sorrow, MD 01/18/17 2120    Fredia Sorrow, MD 01/18/17 2121

## 2017-01-18 NOTE — ED Notes (Signed)
Patient transported to MRI 

## 2017-01-19 ENCOUNTER — Observation Stay (HOSPITAL_COMMUNITY): Payer: Medicare Other

## 2017-01-19 ENCOUNTER — Observation Stay (HOSPITAL_BASED_OUTPATIENT_CLINIC_OR_DEPARTMENT_OTHER): Payer: Medicare Other

## 2017-01-19 DIAGNOSIS — I6522 Occlusion and stenosis of left carotid artery: Secondary | ICD-10-CM | POA: Diagnosis present

## 2017-01-19 DIAGNOSIS — R569 Unspecified convulsions: Secondary | ICD-10-CM | POA: Diagnosis not present

## 2017-01-19 DIAGNOSIS — I651 Occlusion and stenosis of basilar artery: Secondary | ICD-10-CM | POA: Diagnosis present

## 2017-01-19 DIAGNOSIS — Z880 Allergy status to penicillin: Secondary | ICD-10-CM | POA: Diagnosis not present

## 2017-01-19 DIAGNOSIS — I10 Essential (primary) hypertension: Secondary | ICD-10-CM | POA: Diagnosis present

## 2017-01-19 DIAGNOSIS — I675 Moyamoya disease: Secondary | ICD-10-CM | POA: Diagnosis present

## 2017-01-19 DIAGNOSIS — I639 Cerebral infarction, unspecified: Secondary | ICD-10-CM

## 2017-01-19 DIAGNOSIS — Z981 Arthrodesis status: Secondary | ICD-10-CM | POA: Diagnosis not present

## 2017-01-19 DIAGNOSIS — Z7982 Long term (current) use of aspirin: Secondary | ICD-10-CM | POA: Diagnosis not present

## 2017-01-19 DIAGNOSIS — I6521 Occlusion and stenosis of right carotid artery: Secondary | ICD-10-CM | POA: Diagnosis not present

## 2017-01-19 DIAGNOSIS — Z8673 Personal history of transient ischemic attack (TIA), and cerebral infarction without residual deficits: Secondary | ICD-10-CM | POA: Diagnosis not present

## 2017-01-19 DIAGNOSIS — G463 Brain stem stroke syndrome: Secondary | ICD-10-CM | POA: Diagnosis not present

## 2017-01-19 DIAGNOSIS — R51 Headache: Secondary | ICD-10-CM | POA: Diagnosis not present

## 2017-01-19 DIAGNOSIS — Z8249 Family history of ischemic heart disease and other diseases of the circulatory system: Secondary | ICD-10-CM | POA: Diagnosis not present

## 2017-01-19 DIAGNOSIS — F17290 Nicotine dependence, other tobacco product, uncomplicated: Secondary | ICD-10-CM | POA: Diagnosis present

## 2017-01-19 DIAGNOSIS — E785 Hyperlipidemia, unspecified: Secondary | ICD-10-CM | POA: Diagnosis present

## 2017-01-19 DIAGNOSIS — Z79899 Other long term (current) drug therapy: Secondary | ICD-10-CM | POA: Diagnosis not present

## 2017-01-19 DIAGNOSIS — Z823 Family history of stroke: Secondary | ICD-10-CM | POA: Diagnosis not present

## 2017-01-19 LAB — ECHOCARDIOGRAM COMPLETE
HEIGHTINCHES: 66 in
Weight: 2059.98 oz

## 2017-01-19 LAB — LIPID PANEL
CHOL/HDL RATIO: 5.1 ratio
CHOLESTEROL: 193 mg/dL (ref 0–200)
HDL: 38 mg/dL — ABNORMAL LOW (ref >40)
LDL Cholesterol: 142 mg/dL — ABNORMAL HIGH (ref 0–99)
Triglycerides: 64 mg/dL (ref <150)
VLDL: 13 mg/dL (ref 0–40)

## 2017-01-19 MED ORDER — CLOPIDOGREL BISULFATE 75 MG PO TABS
75.0000 mg | ORAL_TABLET | Freq: Every day | ORAL | Status: DC
  Filled 2017-01-19: qty 1

## 2017-01-19 NOTE — Progress Notes (Signed)
SLP Cancellation Note  Patient Details Name: James Marsh MRN: [id number] DOB: [birth year]   Cancelled treatment:       Reason Eval/Treat Not Completed: SLP screened, no needs identified, will sign off SLP screened Pt in room. Pt denies any changes in swallowing, speech, language, or cognition. SLE will be deferred at this time. Reconsult if indicated. SLP will sign off.   Thank you,  Genene Churn, [place]  [place] 01/19/2017, 3:26 PM

## 2017-01-19 NOTE — Progress Notes (Signed)
OT Cancellation Note  Patient Details Name: James Marsh MRN: [id number] DOB: September 25, 1955   Cancelled Treatment:    Reason Eval/Treat Not Completed: OT screened, no needs identified, will sign off. OT screened pt this am, wife present. Pt with generalized weakness, reports this is baseline for past 10 years. Pt verbalizes knowledge of physical limitations due to progressive condition, reporting energy conservation strategies utilized at home. At this time pt is at baseline level of functioning, no increased weakness or coordination difficulties this am. Pt reports he has had 6 short episodes of numbness/tingling since 4:00am. Wife is available to assist at home if necessary; pt uses a [facility] for functional mobility, rarely goes out into community. No further OT needs at this time as pt is at baseline independence with ADL completion.    Guadelupe Sabin, OTR/L  [phone] 01/19/2017, 8:30 AM

## 2017-01-19 NOTE — Progress Notes (Signed)
*  PRELIMINARY RESULTS* Echocardiogram 2D Echocardiogram has been performed.  James Marsh 01/19/2017, 3:35 PM

## 2017-01-19 NOTE — Progress Notes (Signed)
PT Cancellation Note  Patient Details Name: James Marsh MRN: [id number] DOB: 1956/01/10   Cancelled Treatment:    Reason Eval/Treat Not Completed: Other (comment).  Pt is noting his functional level is unchanged.  Wife agrees and so PT spoke with them about keeping PT order live in case they notice a change in his mobility, in which case PT will consult.   Ramond Dial 01/19/2017, 10:53 AM   Mee Hives, PT MS Acute Rehab Dept. Number: [place]

## 2017-01-19 NOTE — Consult Note (Addendum)
[place] A. Merlene Laughter, MD     [email or web address]          James Marsh is an 61 y.o. male.   ASSESSMENT/PLAN: 1. 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 Subsequently, the patient can be placed on a single antiplatelet agent. We prefer aspirin 325. This technically does not represent a aspirin failure as the patient was only taking aspirin every other day over the last several months. Statin is also recommended. I will also do an EEG. There is no need for interventional treatment at this time.   2. 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  3. Residual index finger tingling due to cervical disc disease status post fusion.  4. Hyperlipidemia: Again statin is recommended.    The patient is 61 year old right-handed white male who has a history of moyamoya disease diagnosed about 20 years ago. The patient tells me that during his initial diagnosis he presented with severe headache which lasted for about a week. He suddenly had an episode of loss of vision involving the right eye per the patient. This is associated with the patient passing out. 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 The patient tells me he was placed on aspirin 325 and has been on this over the years. He has had chronic headaches which are quite severe since then. He tells me that the aspirin 325 actually helps with his headaches but also lightly puffing a nicotine pipe and a cup of coffee also helps. He has done this regimen for the last 20 years without problems. Over last several months he has started taking aspirin every other day. He tells me he did consult with his primary care provider on this. Over last 2 weeks or more he has had periorbital tingling associated with tendon of the left upper extremity. He reports that the left upper  extremity is associated with a pulling like sensation as if someone is pulling his arm with a magnetic. Is association with flexor spasm of the fingers. The initial event was about over 2 weeks ago but in the intervening time the episodes have become more frequent and more intense. They last about 2 minutes. He decided to seek medical attention. The patient tells me that he did under go discectomy and fusion due to herniated disc. This is done by Dr. Joya Salm. The chart corroborates this. He had significant weakness of the left upper extremity associated with numbness and tingling. His symptoms have essentially recovered other than residual numbness of the index finger and the left side. The review systems is otherwise negative.     GENERAL: This is a thin pleasant man in no acute distress.  HEENT: Normal  ABDOMEN: soft  EXTREMITIES: No edema   BACK: Normal  SKIN: Normal by inspection.    MENTAL STATUS: Patient is awake and alert. Speech is generally normal without clear dysarthria. The patient was able to state his age appropriately but had difficulty stating the month. Language appear normal and he follows commands briskly.  CRANIAL NERVES: Pupils are equal, round and reactive to light and accomodation; extra ocular movements are full, there is no significant nystagmus; visual fields are full; upper and lower facial muscles are normal in strength and symmetric, there is no flattening of the nasolabial folds; tongue is midline; uvula is midline; shoulder elevation is normal.  MOTOR: Mild the right hip flexion weakness  4+/5 which patient states is chronic. Dorsiflexion 5. The other extremity shows normal tone, bulk and strength. There is no drift in the extremities.  COORDINATION: Left finger to nose is normal, right finger to nose is normal, No rest tremor; no intention tremor; no postural tremor; no bradykinesia.  REFLEXES: Deep tendon reflexes are symmetrical and normal. Babinski reflexes  are flexor bilaterally.   SENSATION: Diminished sensation left index finger which is chronic.       The brain MRI is reviewed in person and shows a tiny faint increased signal on DWI involving the posterior pontine region on the left. There is also a similar finding involving the right midbrain. There is a questionable but similar tiny finding involving the left occipital area. There is minimal deep white matter changes. No hemorrhages appreciated. The MRA shows occluded basilar artery. There is occluded left ICA with poor circulation of the left MCA distal to the trifurcation.     Blood pressure 134/72, pulse 75, temperature 98.1 F (36.7 C), temperature source Oral, resp. rate 16, height '5\' 6"'$  (1.676 m), weight 128 lb 12 oz (58.4 kg), SpO2 97 %.  Past Medical History:  Diagnosis Date  . Allergy    pollen, foods  . Arthritis   . Moya moya disease   . Neuromuscular disorder ([place])   . Stroke [facility])     Past Surgical History:  Procedure Laterality Date  . CERVICAL DISC SURGERY    . VASECTOMY      Family History  Problem Relation Age of Onset  . Cancer Mother 82       ovarian  . Heart disease Father   . Alcohol abuse Father   . Stroke Father   . Cancer Sister        brain  . Cancer Brother        brain  . Cancer Brother        brain  . Heart disease Brother     Social History:  reports that he has been smoking Pipe.  He started smoking about 48 years ago. He has never used smokeless tobacco. He reports that he does not drink alcohol or use drugs.  Allergies:  Allergies  Allergen Reactions  . Penicillins Shortness Of Breath    Has patient had a PCN reaction causing immediate rash, facial/tongue/throat swelling, SOB or lightheadedness with hypotension: yes Has patient had a PCN reaction causing severe rash involving mucus membranes or skin necrosis: no Has patient had a PCN reaction that required hospitalization: yes Has patient had a PCN reaction occurring within  the last 10 years: no If all of the above answers are "NO", then may proceed with Cephalosporin use.   . No [employer] Food Allergies     Patient allergic to nuts and dairy.    Medications: Prior to Admission medications   Medication Sig Start Date End Date Taking? Authorizing Provider  aspirin 325 MG EC tablet Take 325 mg by mouth every other day.    Yes [provider]  b complex vitamins tablet Take 1 tablet by mouth daily.   Yes [provider]  fluticasone (FLONASE) 50 MCG/ACT nasal spray Place 1 spray into both nostrils daily as needed for allergies or rhinitis.   Yes [provider]  vitamin C (ASCORBIC ACID) 500 MG tablet Take 500 mg by mouth daily.   Yes [provider]  vitamin E 400 UNIT capsule Take 400 Units by mouth daily.   Yes [provider]  Scheduled Meds: .  stroke: mapping our early stages of recovery book   Does not apply Once  . aspirin  300 mg Rectal Daily   Or  . aspirin  325 mg Oral Daily  . B-complex with vitamin C  1 tablet Oral Daily  . enoxaparin (LOVENOX) injection  40 mg Subcutaneous Q24H  . vitamin C  500 mg Oral Daily   Continuous Infusions: . sodium chloride 50 mL/hr at 01/19/17 0525   PRN Meds:.acetaminophen **OR** acetaminophen (TYLENOL) oral liquid 160 mg/5 mL **OR** acetaminophen, fluticasone, senna-docusate     Results for orders placed or performed during the hospital encounter of 01/18/17 (from the past 48 hour(s))  Ethanol     Status: None   Collection Time: 01/18/17  5:56 PM  Result Value Ref Range   Alcohol, Ethyl (B) <5 <5 mg/dL    Comment:        LOWEST DETECTABLE LIMIT FOR SERUM ALCOHOL IS 5 mg/dL FOR MEDICAL PURPOSES ONLY   Protime-INR     Status: None   Collection Time: 01/18/17  5:56 PM  Result Value Ref Range   Prothrombin Time 13.4 11.4 - 15.2 seconds   INR 1.02   APTT     Status: None   Collection Time: 01/18/17  5:56 PM  Result Value Ref Range   aPTT 34 24 - 36  seconds  CBC     Status: None   Collection Time: 01/18/17  5:56 PM  Result Value Ref Range   WBC 5.7 4.0 - 10.5 K/uL   RBC 4.78 4.22 - 5.81 MIL/uL   Hemoglobin 14.4 13.0 - 17.0 g/dL   HCT 42.6 39.0 - 52.0 %   MCV 89.1 78.0 - 100.0 fL   MCH 30.1 26.0 - 34.0 pg   MCHC 33.8 30.0 - 36.0 g/dL   RDW 13.9 11.5 - 15.5 %   Platelets 242 150 - 400 K/uL  Differential     Status: None   Collection Time: 01/18/17  5:56 PM  Result Value Ref Range   Neutrophils Relative % 52 %   Neutro Abs 3.0 1.7 - 7.7 K/uL   Lymphocytes Relative 39 %   Lymphs Abs 2.2 0.7 - 4.0 K/uL   Monocytes Relative 6 %   Monocytes Absolute 0.3 0.1 - 1.0 K/uL   Eosinophils Relative 3 %   Eosinophils Absolute 0.1 0.0 - 0.7 K/uL   Basophils Relative 0 %   Basophils Absolute 0.0 0.0 - 0.1 K/uL  Comprehensive metabolic panel     Status: Abnormal   Collection Time: 01/18/17  5:56 PM  Result Value Ref Range   Sodium 138 135 - 145 mmol/L   Potassium 3.8 3.5 - 5.1 mmol/L   Chloride 100 (L) 101 - 111 mmol/L   CO2 27 22 - 32 mmol/L   Glucose, Bld 85 65 - 99 mg/dL   BUN 15 6 - 20 mg/dL   Creatinine, Ser 0.97 0.61 - 1.24 mg/dL   Calcium 9.1 8.9 - 10.3 mg/dL   Total Protein 7.6 6.5 - 8.1 g/dL   Albumin 4.3 3.5 - 5.0 g/dL   AST 18 15 - 41 U/L   ALT 13 (L) 17 - 63 U/L   Alkaline Phosphatase 81 38 - 126 U/L   Total Bilirubin 0.7 0.3 - 1.2 mg/dL   GFR calc non Af Amer >60 >60 mL/min   GFR calc Af Amer >60 >60 mL/min    Comment: (NOTE) The eGFR has been calculated using the CKD EPI  equation. This calculation has not been validated in all clinical situations. eGFR's persistently <60 mL/min signify possible Chronic Kidney Disease.    Anion gap 11 5 - 15  Urine rapid drug screen (hosp performed)not at [facility]     Status: None   Collection Time: 01/18/17  5:56 PM  Result Value Ref Range   Opiates NONE DETECTED NONE DETECTED   Cocaine NONE DETECTED NONE DETECTED   Benzodiazepines NONE DETECTED NONE DETECTED   Amphetamines NONE  DETECTED NONE DETECTED   Tetrahydrocannabinol NONE DETECTED NONE DETECTED   Barbiturates NONE DETECTED NONE DETECTED    Comment:        DRUG SCREEN FOR MEDICAL PURPOSES ONLY.  IF CONFIRMATION IS NEEDED FOR ANY PURPOSE, NOTIFY LAB WITHIN 5 DAYS.        LOWEST DETECTABLE LIMITS FOR URINE DRUG SCREEN Drug Class       Cutoff (ng/mL) Amphetamine      1000 Barbiturate      200 Benzodiazepine   076 Tricyclics       226 Opiates          300 Cocaine          300 THC              50   Urinalysis, Routine w reflex microscopic     Status: None   Collection Time: 01/18/17  5:56 PM  Result Value Ref Range   Color, Urine YELLOW YELLOW   APPearance CLEAR CLEAR   Specific Gravity, Urine 1.009 1.005 - 1.030   pH 7.0 5.0 - 8.0   Glucose, UA NEGATIVE NEGATIVE mg/dL   Hgb urine dipstick NEGATIVE NEGATIVE   Bilirubin Urine NEGATIVE NEGATIVE   Ketones, ur NEGATIVE NEGATIVE mg/dL   Protein, ur NEGATIVE NEGATIVE mg/dL   Nitrite NEGATIVE NEGATIVE   Leukocytes, UA NEGATIVE NEGATIVE  I-stat troponin, ED (not at [facility])     Status: None   Collection Time: 01/18/17  6:40 PM  Result Value Ref Range   Troponin i, poc 0.01 0.00 - 0.08 ng/mL   Comment 3            Comment: Due to the release kinetics of cTnI, a negative result within the first hours of the onset of symptoms does not rule out myocardial infarction with certainty. If myocardial infarction is still suspected, repeat the test at appropriate intervals.   I-Stat Chem 8, ED  (not at [facility])     Status: Abnormal   Collection Time: 01/18/17  6:43 PM  Result Value Ref Range   Sodium 141 135 - 145 mmol/L   Potassium 3.9 3.5 - 5.1 mmol/L   Chloride 103 101 - 111 mmol/L   BUN 15 6 - 20 mg/dL   Creatinine, Ser 1.00 0.61 - 1.24 mg/dL   Glucose, Bld 82 65 - 99 mg/dL   Calcium, Ion 1.15 1.15 - 1.40 mmol/L   TCO2 27 0 - 100 mmol/L   Hemoglobin 12.2 (L) 13.0 - 17.0 g/dL   HCT 36.0 (L) 39.0 - 52.0 %  Lipid panel     Status: Abnormal    Collection Time: 01/19/17  5:59 AM  Result Value Ref Range   Cholesterol 193 0 - 200 mg/dL   Triglycerides 64 <150 mg/dL   HDL 38 (L) >40 mg/dL   Total CHOL/HDL Ratio 5.1 RATIO   VLDL 13 0 - 40 mg/dL   LDL Cholesterol 142 (H) 0 - 99 mg/dL    Comment:  Total Cholesterol/HDL:CHD Risk Coronary Heart Disease Risk Table                     Men   Women  1/2 Average Risk   3.4   3.3  Average Risk       5.0   4.4  2 X Average Risk   9.6   7.1  3 X Average Risk  23.4   11.0        Use the calculated Patient Ratio above and the CHD Risk Table to determine the patient's CHD Risk.        ATP III CLASSIFICATION (LDL):  <100     mg/dL   Optimal  100-129  mg/dL   Near or Above                    Optimal  130-159  mg/dL   Borderline  160-189  mg/dL   High  >190     mg/dL   Very High     Studies/Results:  CAROTID DOPPLERS The following velocity measurements were obtained:  RIGHT  ICA:  135/56 cm/sec  CCA:  426/83 cm/sec  SYSTOLIC ICA/CCA RATIO:  1.0  DIASTOLIC ICA/CCA RATIO:  1.1  ECA:  89 cm/sec  LEFT  ICA:  Chronically occluded  CCA:  82/9 cm/sec  ECA:  100 cm/sec  RIGHT CAROTID ARTERY: Minor echogenic shadowing plaque formation. No hemodynamically significant right ICA stenosis, velocity elevation, or turbulent flow. Degree of narrowing less than 50%.  RIGHT VERTEBRAL ARTERY:  Antegrade  LEFT CAROTID ARTERY: Left CCA and ECA remain patent with atherosclerotic change. 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  LEFT VERTEBRAL ARTERY:  Antegrade  IMPRESSION: Chronically occluded left ICA.  Right ICA narrowing less than 50%  Patent antegrade vertebral flow bilaterally   Electronically Signed   By: Jerilynn Mages.  Shick M.D.   On: 01/19/2017 10:38   Vitals   Height Weight BMI (Calculated)  '5\' 6"'$  (1.676 m) 128 lb 12 oz (58.4 kg) 21.       BRAIN MRI FINDINGS: Brain: There is at least one 2 mm focus of restricted diffusion, LEFT mid  pons, consistent with acute infarction (4/73). There may be a second 2 mm area of acute infarction, RIGHT paramedian midbrain, (4/77), an area which is more closely correlated with perioral numbness. Finally, there is a possible third 2 mm area of acute infarction, LEFT posterior occipital cortex, (4/77).  No hemorrhage, mass lesion, hydrocephalus, or extra-axial fluid.  Normal cerebral volume. Mega cisterna magna. Mild subcortical and periventricular T2 and FLAIR hyperintensities, likely chronic microvascular ischemic change. There is an old LEFT caudate and punctate LEFT putaminal lacunar infarct.  Vascular: The LEFT internal carotid artery is occluded based on flow void, in its skull base and supraclinoid segments. The RIGHT internal carotid artery appears patent and hypertrophied. Both anterior and middle cerebral arteries demonstrate normal flow voids. The basilar artery appears small but patent. In its midportion, the basilar appears thick-walled. It is unclear if the RIGHT vertebral is patent, based on lower most images to the foramen magnum. The LEFT vertebral is patent based on flow void.  Skull and upper cervical spine: Normal marrow signal.  Sinuses/Orbits: Negative orbits.  Minor paranasal sinus disease.  Other: BILATERAL mastoid effusions.  Compared with recent CT, the areas of infarction on MR are not visible.  IMPRESSION: At least one small focus of acute nonhemorrhagic infarction can be confirmed, LEFT mid pons. There may be other  punctate foci of acute ischemia affecting the RIGHT midbrain and LEFT occipital lobe.  Mild atrophy and small vessel disease.  Apparent chronic occlusion of the LEFT ICA. The basilar appears chronically diseased, and the RIGHT vertebral may occluded.  BILATERAL mastoid effusions, uncertain significance.              BRAIN MRI MRA FINDINGS: No antegrade flow an the left internal carotid artery through  the upper cervical region, skullbase and siphon region.  Right internal carotid artery is a large vessel widely patent through the skullbase and siphon region. This vessel supplies the right middle cerebral artery and both anterior cerebral arteries, via a patent anterior communicating artery. Left A1 segment also shows flow. Flow is present within a host of small vessels at the base of the brain on the left, with some demonstration of small but larger left MCA branches.  Both vertebral arteries are patent at the foramen magnum level. Left vertebral artery terminates in PICA with no antegrade flow seen beyond that. Right vertebral artery supplies a large PICA with a patent contribution beyond that to the basilar. Basilar artery supplies the right anterior inferior cerebellar artery and is than occluded. Basilar tip receives it supply from patent posterior communicating arteries. Flow is present within both superior cerebellar and posterior cerebral arteries.  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  Right ICA widely patent into the brain, supplying the right middle cerebral artery territory and anterior cerebral artery territory. Patent anterior communicating artery which allows supply of the left anterior cerebral artery. Multiple small irregular vessels at the base of the brain on the left consistent with the clinical history of moyamoya syndrome. No normal appearing M1 segment. Small vessels result in reconstituted supply to more distal left MCA branches.  The vertebral arteries are patent at the foramen magnum, supplying large posterior inferior cerebellar arteries. Flow from the right vertebral artery supplies the proximal basilar which shows flow as distal as the right anterior inferior cerebellar artery but is occluded beyond that. Bilateral posterior communicating arteries reconstituted flow in the basilar tip, with supply to the superior cerebellar  and posterior cerebral arteries.         Earmon Sherrow A. Merlene Laughter, M.D.  Diplomate, Tax adviser of Psychiatry and Neurology ( Neurology). 01/19/2017, 8:20 PM

## 2017-01-20 LAB — HEMOGLOBIN A1C
Hgb A1c MFr Bld: 5.2 % (ref 4.8–5.6)
Mean Plasma Glucose: 103 mg/dL

## 2017-01-20 LAB — HIV ANTIBODY (ROUTINE TESTING W REFLEX): HIV Screen 4th Generation wRfx: NONREACTIVE

## 2017-01-20 MED ORDER — CLOPIDOGREL BISULFATE 75 MG PO TABS: 75.0000 mg | ORAL_TABLET | Freq: Every day | ORAL | 2 refills | Status: DC

## 2017-01-20 NOTE — Care Management Important Message (Signed)
Important Message  Patient Details  Name: James Marsh MRN: [id number] Date of Birth: [birth year]   Medicare Important Message Given:  Yes    Sherald Barge, RN 01/20/2017, 12:36 PM

## 2017-01-20 NOTE — Care Management Note (Signed)
Case Management Note  Patient Details  Name: James Marsh MRN: [id number] Date of Birth: [birth year]  Subjective/Objective:                  Pt admitted with moyamoya syndrome. Pt from home with spouse. He is ind with ADL's. He has PCP care, transportation to appointments. He has insurance with drug coverage. PT/OT recommends no f/u.   Action/Plan: Pt discharging home today with self care NO CM needs noted.   Expected Discharge Date:  01/20/17               Expected Discharge Plan:  Home/Self Care  In-House Referral:  NA  Discharge planning Services  CM Consult  Post Acute Care Choice:  NA Choice offered to:  NA  Status of Service:  Completed, signed off  Sherald Barge, RN 01/20/2017, 12:36 PM

## 2017-01-20 NOTE — Progress Notes (Signed)
PT Cancellation Note  Patient Details Name: James Marsh MRN: [id number] DOB: 03-Oct-1955   Cancelled Treatment:    Reason Eval/Treat Not Completed: Patient declined, no reason specified. PT attempting pt evaluation again this date. Pt continues to report being at baseline, denies any need for PT evaluation at this time. Pt is aware of location and services of [facility] and will contact PCP if he feels services are needed in the future. No additional PT needs at this time; PT signing off.   10:13 AM, 01/20/17 Etta Grandchild, PT, DPT Physical Therapist - [place] [phone] (Office)    [name],James Marsh 01/20/2017, 10:11 AM

## 2017-01-20 NOTE — Discharge Summary (Signed)
Physician Discharge Summary  James Marsh ATF:[id number] DOB: 09-24-1955 DOA: 01/18/2017  PCP: James Everts, MD  Admit date: 01/18/2017 Discharge date: 01/20/2017  Time spent: 45 minutes  Recommendations for Outpatient Follow-up:  -Will be discharged home today. -Advised to follow up with neurology as scheduled.   Discharge Diagnoses:  Active Problems:   Moyamoya syndrome   History of completed stroke   Chronic daily headache   Acute CVA (cerebrovascular accident) [facility])   Discharge Condition: Stable and improved  Filed Weights   01/18/17 1450 01/18/17 2326  Weight: 60.8 kg (134 lb) 58.4 kg (128 lb 12 oz)    History of present illness:  As per Dr. Marin Marsh on 6/27: James Marsh is an 61 y.o. male with hx of Moya moya disease, diagnosed with both MRA and cerebral arteriogram in the 90's, hx of on going pipe tobacco use, complete occlusion of the left ICA, chronic daily HA, hx of cervical disk disease, prior CVA, presented to the ER with several day hx of intermittent circumoral paresthesia and left upper ext weakness.  While head CT was negative, MRI of the brain suggest acute CVAs, with several area on both hemispheres.  See MRI report.  Serology was unremarkable, and EKG showed NSR.      Hospital Course:   Multiple Acute CVAs -Likely due to [place]. -ECHO:Ejection fraction of 50-55%, wall thickness increase in a pattern of LVH -carotid dopplers: Chronically occluded left ICA, right ICA narrowing less than 50%, vertebral flow antegrade bilaterally. -Neurology has recommended dual antiplatelet therapy for 3 months, however patient refuses to take Plavix. -Per PT/OT/ST patient is at baseline level of functioning.  Rest of chronic condition stable  Procedures:  As above   Consultations:  Neurology  Discharge Instructions  Discharge Instructions    Diet - low sodium heart healthy    Complete by:  As directed    Increase activity slowly    Complete  by:  As directed      Allergies as of 01/20/2017      Reactions   Penicillins Shortness Of Breath   Has patient had a PCN reaction causing immediate rash, facial/tongue/throat swelling, SOB or lightheadedness with hypotension: yes Has patient had a PCN reaction causing severe rash involving mucus membranes or skin necrosis: no Has patient had a PCN reaction that required hospitalization: yes Has patient had a PCN reaction occurring within the last 10 years: no If all of the above answers are "NO", then may proceed with Cephalosporin use.   No [employer] Food Allergies    Patient allergic to nuts and dairy.      Medication List    TAKE these medications   aspirin 325 MG EC tablet Take 325 mg by mouth every other day.   b complex vitamins tablet Take 1 tablet by mouth daily.   clopidogrel 75 MG tablet Commonly known as:  PLAVIX Take 1 tablet (75 mg total) by mouth daily with breakfast. Start taking on:  01/21/2017   fluticasone 50 MCG/ACT nasal spray Commonly known as:  FLONASE Place 1 spray into both nostrils daily as needed for allergies or rhinitis.   vitamin C 500 MG tablet Commonly known as:  ASCORBIC ACID Take 500 mg by mouth daily.   vitamin E 400 UNIT capsule Take 400 Units by mouth daily.      Allergies  Allergen Reactions  . Penicillins Shortness Of Breath    Has patient had a PCN reaction causing immediate rash, facial/tongue/throat  swelling, SOB or lightheadedness with hypotension: yes Has patient had a PCN reaction causing severe rash involving mucus membranes or skin necrosis: no Has patient had a PCN reaction that required hospitalization: yes Has patient had a PCN reaction occurring within the last 10 years: no If all of the above answers are "NO", then may proceed with Cephalosporin use.   . No [employer] Food Allergies     Patient allergic to nuts and dairy.   Follow-up Information    James Everts, MD. Schedule an appointment as soon as  possible for a visit in 2 week(s).   Specialty:  Family Medicine Contact information: [address] [place] [address] [phone]            The results of significant diagnostics from this hospitalization (including imaging, microbiology, ancillary and laboratory) are listed below for reference.    Significant Diagnostic Studies: Ct Head Wo Contrast  Result Date: 01/18/2017 CLINICAL DATA:  Several episodes of tingling and numbness around mouth for 3-4 days. Headache. EXAM: CT HEAD WITHOUT CONTRAST TECHNIQUE: Contiguous axial images were obtained from the base of the skull through the vertex without intravenous contrast. COMPARISON:  None. FINDINGS: Brain: No evidence of acute infarction, hemorrhage, hydrocephalus, extra-axial collection or mass lesion/mass effect. Mega cisterna magna. Normal cerebral volume. No white matter disease. Vascular: Calcification of the cavernous internal carotid arteries and distal vertebral arteries consistent with cerebrovascular atherosclerotic disease. No signs of intracranial large vessel occlusion. Skull: Normal. Negative for fracture or focal lesion. Sinuses/Orbits: No acute finding. Other: Small BILATERAL mastoid effusions. No nasopharyngeal process is evident. IMPRESSION: Negative exam.  No cause seen for the reported symptoms. Electronically Signed   By: Staci Righter M.D.   On: 01/18/2017 19:03   Mr Brain Wo Contrast (neuro Protocol)  Result Date: 01/18/2017 CLINICAL DATA:  Perioral numbness. This began earlier today. In the patient's Epic chart, history is given of Moyamoya syndrome with previous TIA and stroke. EXAM: MRI HEAD WITHOUT CONTRAST TECHNIQUE: Multiplanar, multiecho pulse sequences of the brain and surrounding structures were obtained without intravenous contrast. COMPARISON:  CT head was negative. FINDINGS: Brain: There is at least one 2 mm focus of restricted diffusion, LEFT mid pons, consistent with acute infarction (4/73).  There may be a second 2 mm area of acute infarction, RIGHT paramedian midbrain, (4/77), an area which is more closely correlated with perioral numbness. Finally, there is a possible third 2 mm area of acute infarction, LEFT posterior occipital cortex, (4/77). No hemorrhage, mass lesion, hydrocephalus, or extra-axial fluid. Normal cerebral volume. Mega cisterna magna. Mild subcortical and periventricular T2 and FLAIR hyperintensities, likely chronic microvascular ischemic change. There is an old LEFT caudate and punctate LEFT putaminal lacunar infarct. Vascular: The LEFT internal carotid artery is occluded based on flow void, in its skull base and supraclinoid segments. The RIGHT internal carotid artery appears patent and hypertrophied. Both anterior and middle cerebral arteries demonstrate normal flow voids. The basilar artery appears small but patent. In its midportion, the basilar appears thick-walled. It is unclear if the RIGHT vertebral is patent, based on lower most images to the foramen magnum. The LEFT vertebral is patent based on flow void. Skull and upper cervical spine: Normal marrow signal. Sinuses/Orbits: Negative orbits.  Minor paranasal sinus disease. Other: BILATERAL mastoid effusions. Compared with recent CT, the areas of infarction on MR are not visible. IMPRESSION: At least one small focus of acute nonhemorrhagic infarction can be confirmed, LEFT mid pons. There may be other  punctate foci of acute ischemia affecting the RIGHT midbrain and LEFT occipital lobe. Mild atrophy and small vessel disease. Apparent chronic occlusion of the LEFT ICA. The basilar appears chronically diseased, and the RIGHT vertebral may occluded. BILATERAL mastoid effusions, uncertain significance. Electronically Signed   By: Staci Righter M.D.   On: 01/18/2017 20:27   US Carotid Bilateral (at [facility] And [facility] Only)  Result Date: 01/19/2017 CLINICAL DATA:  Stroke symptoms, history of smoking, perioral paresthesias EXAM:  BILATERAL CAROTID DUPLEX ULTRASOUND TECHNIQUE: Pearline Cables scale imaging, color Doppler and duplex ultrasound were performed of bilateral carotid and vertebral arteries in the neck. COMPARISON:  01/19/2017 MRA brain FINDINGS: Criteria: Quantification of carotid stenosis is based on velocity parameters that correlate the residual internal carotid diameter with NASCET-based stenosis levels, using the diameter of the distal internal carotid lumen as the denominator for stenosis measurement. The following velocity measurements were obtained: RIGHT ICA:  135/56 cm/sec CCA:  983/38 cm/sec SYSTOLIC ICA/CCA RATIO:  1.0 DIASTOLIC ICA/CCA RATIO:  1.1 ECA:  89 cm/sec LEFT ICA:  Chronically occluded CCA:  82/9 cm/sec ECA:  100 cm/sec RIGHT CAROTID ARTERY: Minor echogenic shadowing plaque formation. No hemodynamically significant right ICA stenosis, velocity elevation, or turbulent flow. Degree of narrowing less than 50%. RIGHT VERTEBRAL ARTERY:  Antegrade LEFT CAROTID ARTERY: Left CCA and ECA remain patent with atherosclerotic change. 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 LEFT VERTEBRAL ARTERY:  Antegrade IMPRESSION: Chronically occluded left ICA. Right ICA narrowing less than 50% Patent antegrade vertebral flow bilaterally Electronically Signed   By: Jerilynn Mages.  Shick M.D.   On: 01/19/2017 10:38   Mr Jodene Nam Head/brain SN Cm  Result Date: 01/19/2017 CLINICAL DATA:  Weakness. Headache. Oral numbness. History of moyamoya syndrome. Acute infarction left pons. EXAM: MRA HEAD WITHOUT CONTRAST TECHNIQUE: Angiographic images of the Circle of Willis were obtained using MRA technique without intravenous contrast. COMPARISON:  MRI 01/18/2017.  CT 01/18/2017. FINDINGS: No antegrade flow an the left internal carotid artery through the upper cervical region, skullbase and siphon region. Right internal carotid artery is a large vessel widely patent through the skullbase and siphon region. This vessel supplies the right middle cerebral artery and  both anterior cerebral arteries, via a patent anterior communicating artery. Left A1 segment also shows flow. Flow is present within a host of small vessels at the base of the brain on the left, with some demonstration of small but larger left MCA branches. Both vertebral arteries are patent at the foramen magnum level. Left vertebral artery terminates in PICA with no antegrade flow seen beyond that. Right vertebral artery supplies a large PICA with a patent contribution beyond that to the basilar. Basilar artery supplies the right anterior inferior cerebellar artery and is than occluded. Basilar tip receives it supply from patent posterior communicating arteries. Flow is present within both superior cerebellar and posterior cerebral arteries. 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 Right ICA widely patent into the brain, supplying the right middle cerebral artery territory and anterior cerebral artery territory. Patent anterior communicating artery which allows supply of the left anterior cerebral artery. Multiple small irregular vessels at the base of the brain on the left consistent with the clinical history of moyamoya syndrome. No normal appearing M1 segment. Small vessels result in reconstituted supply to more distal left MCA branches. The vertebral arteries are patent at the foramen magnum, supplying large posterior inferior cerebellar arteries. Flow from the right vertebral artery supplies the proximal basilar which shows flow as distal as the  right anterior inferior cerebellar artery but is occluded beyond that. Bilateral posterior communicating arteries reconstituted flow in the basilar tip, with supply to the superior cerebellar and posterior cerebral arteries. Electronically Signed   By: Nelson Chimes M.D.   On: 01/19/2017 10:27    Microbiology: No results found for this or any previous visit (from the past 240 hour(s)).   Labs: Basic Metabolic Panel:  Recent Labs Lab  01/18/17 1756 01/18/17 1843  NA 138 141  K 3.8 3.9  CL 100* 103  CO2 27  --   GLUCOSE 85 82  BUN 15 15  CREATININE 0.97 1.00  CALCIUM 9.1  --    Liver Function Tests:  Recent Labs Lab 01/18/17 1756  AST 18  ALT 13*  ALKPHOS 81  BILITOT 0.7  PROT 7.6  ALBUMIN 4.3   No results for input(s): LIPASE, AMYLASE in the last 168 hours. No results for input(s): AMMONIA in the last 168 hours. CBC:  Recent Labs Lab 01/18/17 1756 01/18/17 1843  WBC 5.7  --   NEUTROABS 3.0  --   HGB 14.4 12.2*  HCT 42.6 36.0*  MCV 89.1  --   PLT 242  --    Cardiac Enzymes: No results for input(s): CKTOTAL, CKMB, CKMBINDEX, TROPONINI in the last 168 hours. BNP: BNP (last 3 results) No results for input(s): BNP in the last 8760 hours.  ProBNP (last 3 results) No results for input(s): PROBNP in the last 8760 hours.  CBG: No results for input(s): GLUCAP in the last 168 hours.     SignedLelon Frohlich  [facility] Pager: [phone] 01/20/2017, 5:08 PM

## 2017-01-23 ENCOUNTER — Telehealth: Payer: Self-pay | Admitting: Family Medicine

## 2017-01-23 NOTE — Telephone Encounter (Signed)
Patient's wife left message on voice mail today at 10:18 stating that she needed to alert Dr. Meda Coffee and let her husband had been hospitalized via the emergency room, but has been discharged. She would like to discuss the tests and the results. F/U APPT MADE July 11 @ 11AM

## 2017-01-23 NOTE — Telephone Encounter (Signed)
Called and spoke to anthonys wife. Just wanted to update [place] that he was in [place]. And and numerous tests done.

## 2017-02-01 ENCOUNTER — Encounter: Payer: Self-pay | Admitting: Family Medicine

## 2017-02-01 ENCOUNTER — Ambulatory Visit (INDEPENDENT_AMBULATORY_CARE_PROVIDER_SITE_OTHER): Payer: Medicare Other | Admitting: Family Medicine

## 2017-02-01 VITALS — BP 112/58 | HR 62 | Temp 98.2°F | Resp 16 | Ht 66.0 in | Wt 131.1 lb

## 2017-02-01 DIAGNOSIS — R519 Headache, unspecified: Secondary | ICD-10-CM

## 2017-02-01 DIAGNOSIS — I675 Moyamoya disease: Secondary | ICD-10-CM

## 2017-02-01 DIAGNOSIS — I639 Cerebral infarction, unspecified: Secondary | ICD-10-CM | POA: Diagnosis not present

## 2017-02-01 DIAGNOSIS — E785 Hyperlipidemia, unspecified: Secondary | ICD-10-CM | POA: Diagnosis not present

## 2017-02-01 DIAGNOSIS — I2510 Atherosclerotic heart disease of native coronary artery without angina pectoris: Secondary | ICD-10-CM | POA: Insufficient documentation

## 2017-02-01 DIAGNOSIS — R51 Headache: Secondary | ICD-10-CM

## 2017-02-01 NOTE — Progress Notes (Signed)
Chief Complaint  Patient presents with  . Follow-up   Follow up after hospital stay He and sig other had many questions about his tests reviewed his brain CT, MRI, MRA and answered questions, also carotid dopplers, EKG and echocardiogram. He is going to see Dr Merlene Laughter in follow up Is taking daily ASA 325 Refuses plavix Refuses statin- I calculated his framingham risk and it is over 14%.   He has known cerebrovascular and cardiovascular disease and that research would indicate he will live longer, prevent future events on a statin.   His BP is good today He has indications on EKG and echo of CAD with infarct and hypokinesis in inferior portion.  I recommend a cardiology referral, testing and potential cath.  He refuses. He is having ongoing recurring spells of face numbness and left hand symptoms sensory and motor.    Patient Active Problem List   Diagnosis Date Noted  . CAD (coronary artery disease) 02/01/2017  . Acute CVA (cerebrovascular accident) ([place]) 01/18/2017  . Moyamoya syndrome 01/09/2017  . Umbilical hernia 87/56/4332  . Cervical disc disease with myelopathy 01/09/2017  . History of completed stroke 01/09/2017  . Chronic daily headache 01/09/2017  . Environmental allergies 01/09/2017  . Food allergy 01/09/2017    Outpatient Encounter Prescriptions as of 02/01/2017  Medication Sig  . aspirin 325 MG EC tablet Take 325 mg by mouth daily.   Marland Kitchen b complex vitamins tablet Take 1 tablet by mouth daily.  . vitamin C (ASCORBIC ACID) 500 MG tablet Take 500 mg by mouth daily.  . vitamin E 400 UNIT capsule Take 400 Units by mouth daily.   No facility-administered encounter medications on file as of 02/01/2017.     Allergies  Allergen Reactions  . Penicillins Shortness Of Breath    Has patient had a PCN reaction causing immediate rash, facial/tongue/throat swelling, SOB or lightheadedness with hypotension: yes Has patient had a PCN reaction causing severe rash involving  mucus membranes or skin necrosis: no Has patient had a PCN reaction that required hospitalization: yes Has patient had a PCN reaction occurring within the last 10 years: no If all of the above answers are "NO", then may proceed with Cephalosporin use.   . No [employer] Food Allergies     Patient allergic to nuts and dairy.    Review of Systems  Constitutional: Positive for activity change and fatigue. Negative for appetite change and unexpected weight change.  HENT: Positive for congestion and hearing loss. Negative for rhinorrhea, sinus pressure, trouble swallowing and voice change.        Fullness around ears  Eyes: Negative for photophobia and visual disturbance.  Respiratory: Negative for cough and shortness of breath.   Cardiovascular: Negative for chest pain, palpitations and leg swelling.  Gastrointestinal: Negative for constipation and diarrhea.  Genitourinary: Negative for difficulty urinating and frequency.  Musculoskeletal: Positive for back pain and neck pain.       Chronic neck and back stiffness  Neurological: Positive for dizziness and headaches. Negative for facial asymmetry and speech difficulty.  Psychiatric/Behavioral: Negative for behavioral problems and dysphoric mood. The patient is not nervous/anxious.     BP (!) 112/58 (BP Location: Right Arm, Patient Position: Sitting, Cuff Size: Normal)   Pulse 62   Temp 98.2 F (36.8 C) (Temporal)   Resp 16   Ht 5\' 6"  (1.676 m)   Wt 131 lb 1.3 oz (59.5 kg)   SpO2 96%   BMI 21.16 kg/m   Physical  Exam  Constitutional: He is oriented to person, place, and time. He appears well-developed and well-nourished. No distress.  hesitant speech.  Looks to wife  HENT:  Head: Normocephalic and atraumatic.  Right Ear: External ear normal.  Left Ear: External ear normal.  Mouth/Throat: Oropharynx is clear and moist.  Right TM partially obstructed dry cerumen  Eyes: EOM are normal. Pupils are equal, round, and reactive to light.   Neck: No thyromegaly present.  stiff  Cardiovascular: Normal rate, regular rhythm and normal heart sounds.   Pulmonary/Chest: Effort normal and breath sounds normal.  Musculoskeletal: Normal range of motion. He exhibits no edema.  Moves all 4 extremities well  Lymphadenopathy:    He has no cervical adenopathy.  Neurological: He is alert and oriented to person, place, and time.  Psychiatric: He has a normal mood and affect. His behavior is normal.    ASSESSMENT/PLAN:  1. Moyamoya syndrome Buy history  2. Hyperlipidemia, unspecified hyperlipidemia type Refuses statin  3. Chronic daily headache  4. Widespread vascular disease and atherosclerosis , seen on carotid dopplers, MRA and echocardiogram. Discussed this is not caused by but it affects the moyamoya and is amenable to preventative measures.    Greater than 50% of this visit was spent in counseling and coordinating care.  Total face to face time:   45 min   Patient Instructions  *Please call Sr Teoh's office and see if he can be seen sooner than end of month*  Continue the aspirin daily  See Dr Merlene Laughter as scheduled  Consider cholesterol management Consider reducing smoking  See me on usual schedule      Raylene Everts, MD

## 2017-02-01 NOTE — Patient Instructions (Addendum)
*  Please call Sr Teoh's office and see if he can be seen sooner than end of month*  Continue the aspirin daily  See Dr Merlene Laughter as scheduled  Consider cholesterol management Consider reducing smoking  See me on usual schedule

## 2017-02-02 ENCOUNTER — Telehealth: Payer: Self-pay | Admitting: Family Medicine

## 2017-02-02 NOTE — Telephone Encounter (Signed)
FYI: I called Dr Deeann Saint office (schedulding is done in [place] for the [place] office) and requested earlier appt than existing appt of 7/30. They don't have any appts available until August. I will encourage patient to call Teoh's office and request a last minute appt should they have any cancellations in the next two weeks.

## 2017-02-14 DIAGNOSIS — R569 Unspecified convulsions: Secondary | ICD-10-CM | POA: Diagnosis not present

## 2017-02-14 DIAGNOSIS — R51 Headache: Secondary | ICD-10-CM | POA: Diagnosis not present

## 2017-02-14 DIAGNOSIS — G463 Brain stem stroke syndrome: Secondary | ICD-10-CM | POA: Diagnosis not present

## 2017-02-14 DIAGNOSIS — I675 Moyamoya disease: Secondary | ICD-10-CM | POA: Diagnosis not present

## 2017-02-20 ENCOUNTER — Ambulatory Visit (INDEPENDENT_AMBULATORY_CARE_PROVIDER_SITE_OTHER): Payer: Medicare Other | Admitting: Otolaryngology

## 2017-02-20 DIAGNOSIS — H6123 Impacted cerumen, bilateral: Secondary | ICD-10-CM | POA: Diagnosis not present

## 2017-02-20 DIAGNOSIS — H903 Sensorineural hearing loss, bilateral: Secondary | ICD-10-CM | POA: Diagnosis not present

## 2017-03-13 ENCOUNTER — Ambulatory Visit: Payer: Self-pay | Admitting: Neurology

## 2017-10-02 ENCOUNTER — Encounter: Payer: Self-pay | Admitting: Family Medicine

## 2018-01-10 ENCOUNTER — Ambulatory Visit: Payer: Medicare Other | Admitting: Family Medicine

## 2018-08-29 DIAGNOSIS — L304 Erythema intertrigo: Secondary | ICD-10-CM | POA: Diagnosis not present

## 2018-09-27 IMAGING — MR MR MRA HEAD W/O CM
1 series · 13 of 48 positions shown · non-contrast
Comparison: MRI 01/18/2017.  CT 01/18/2017.

CLINICAL DATA: Weakness. Headache. Oral numbness. History of
moyamoya syndrome. Acute infarction left pons.

EXAM:
MRA HEAD WITHOUT CONTRAST
TECHNIQUE: Angiographic images of the Circle of Willis were obtained using MRA
technique without intravenous contrast.

[Series 3: MRA · axial · 0.6mm · 0.33mm/px · z∈[-127,-24]mm · 13 of 181 slices shown]
[im 1/181]
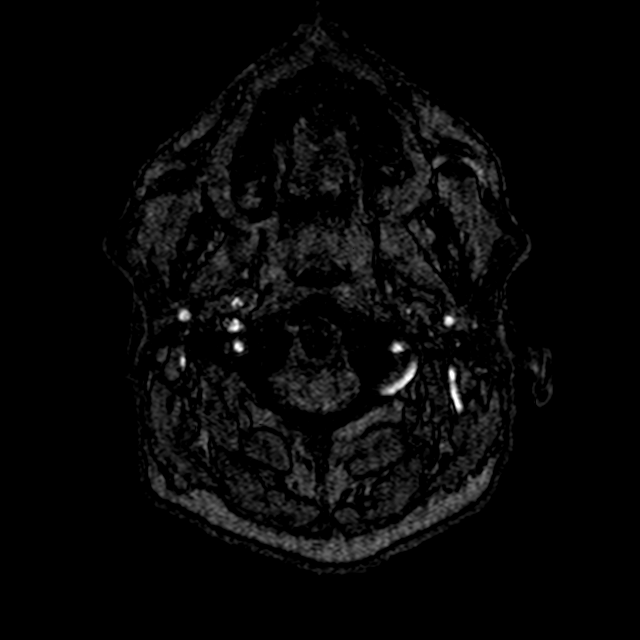
[im 4/181]
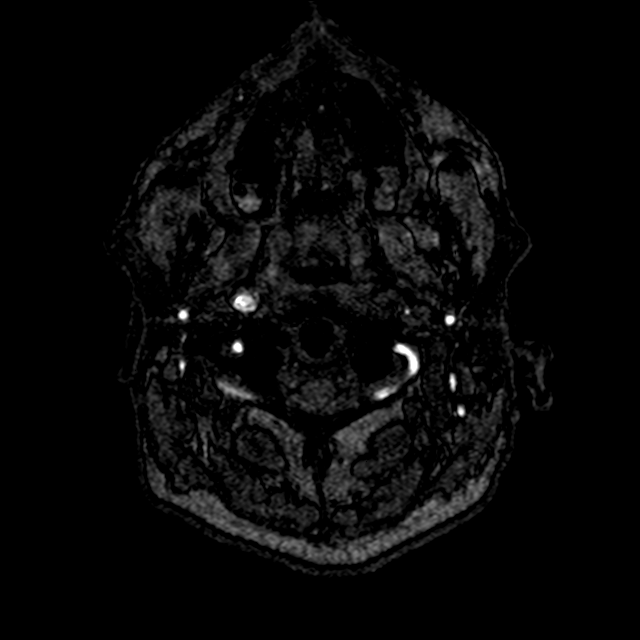
[im 12/181]
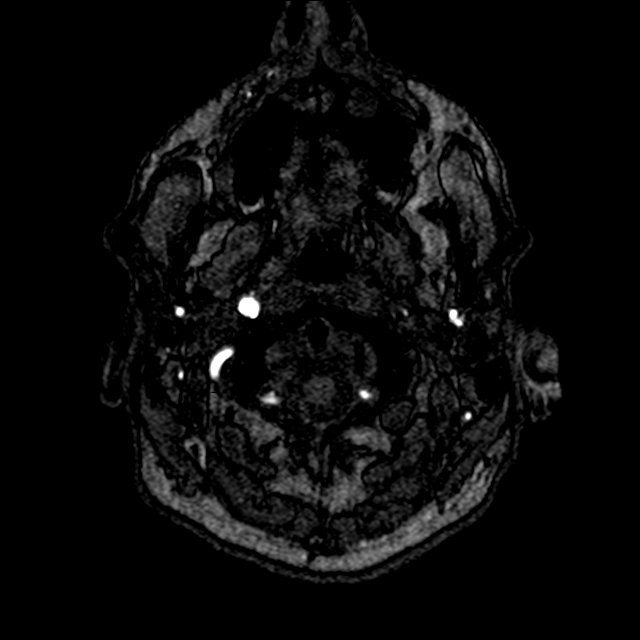
[im 31/181]
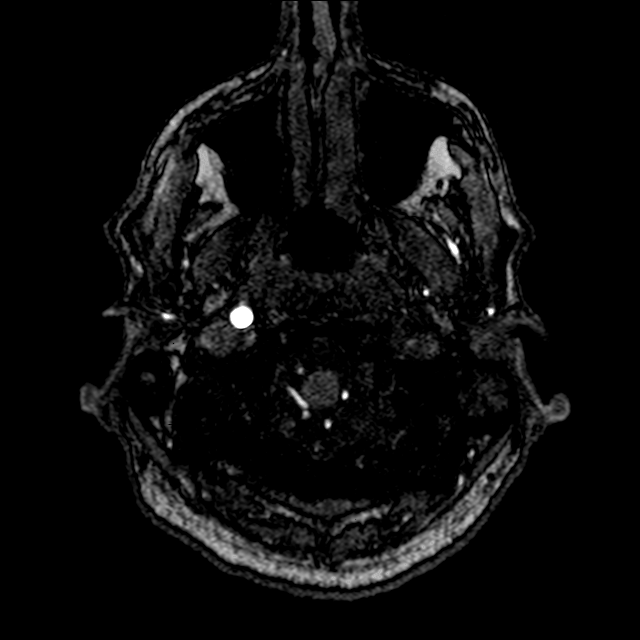
[im 35/181]
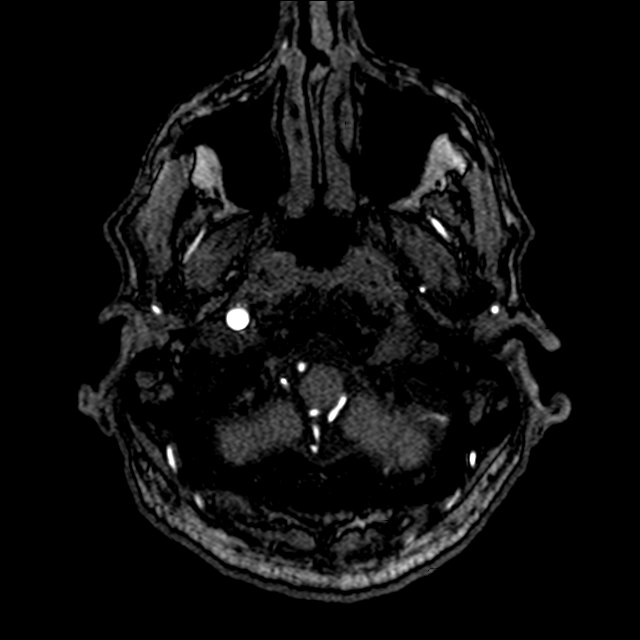
[im 58/181]
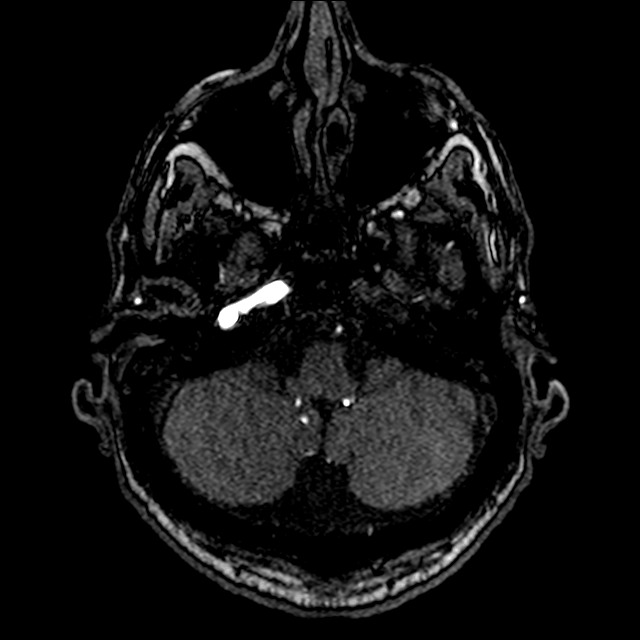
[im 81/181]
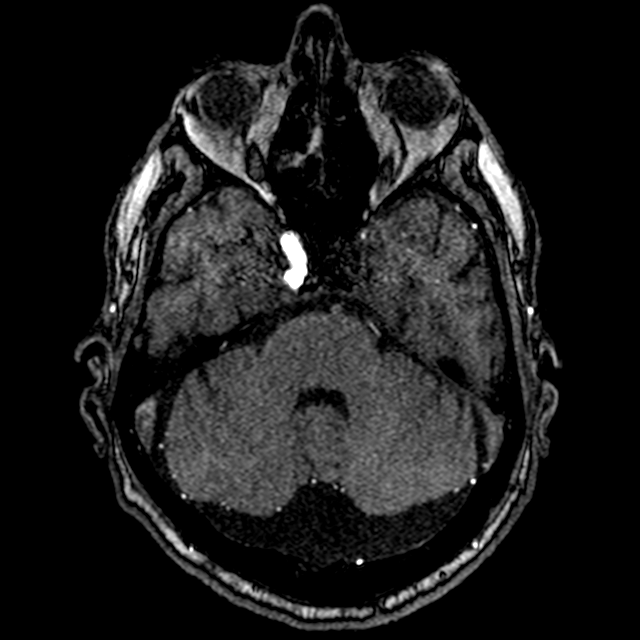
[im 92/181]
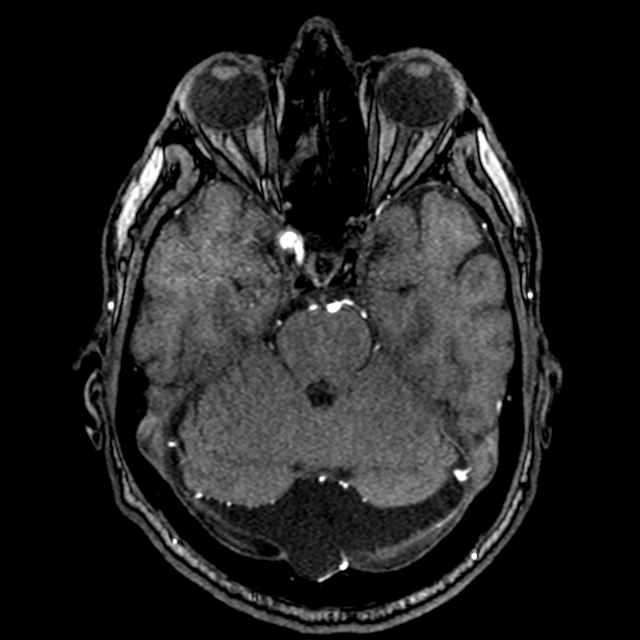
[im 104/181]
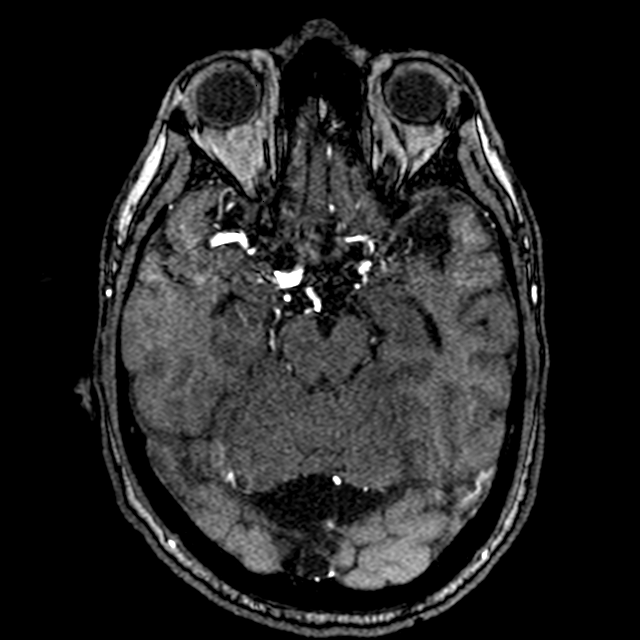
[im 127/181]
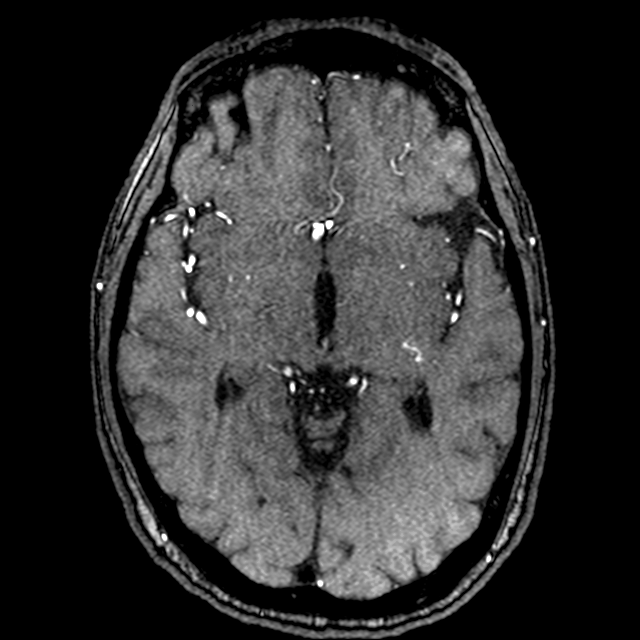
[im 150/181]
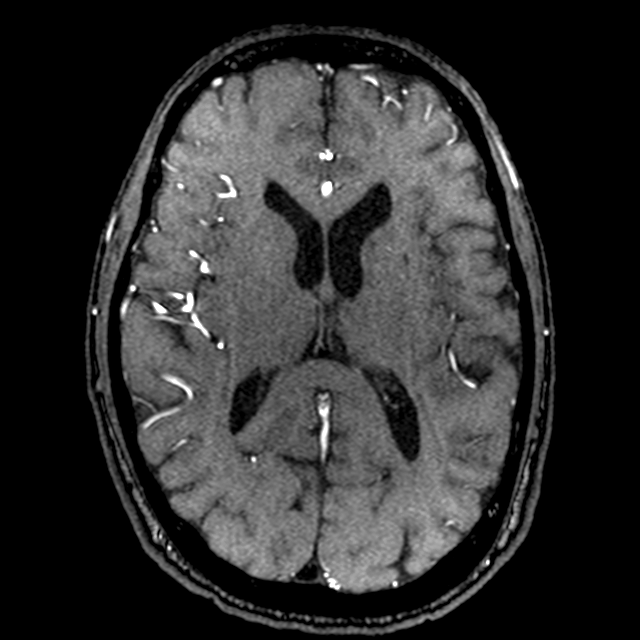
[im 154/181]
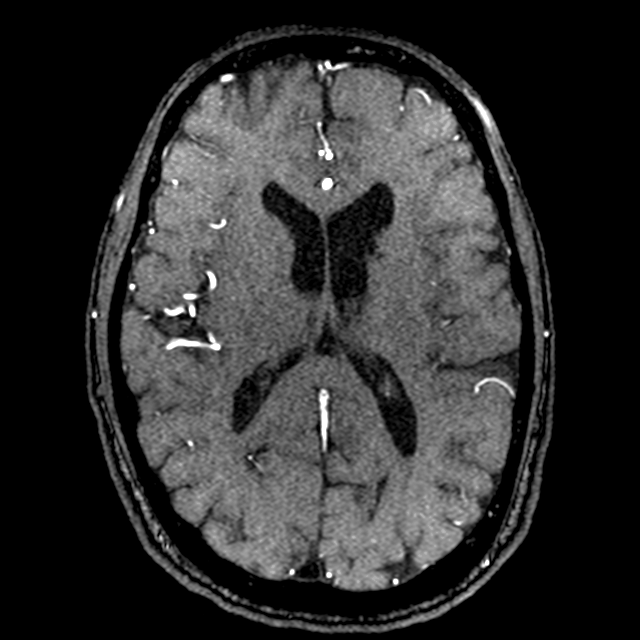
[im 173/181]
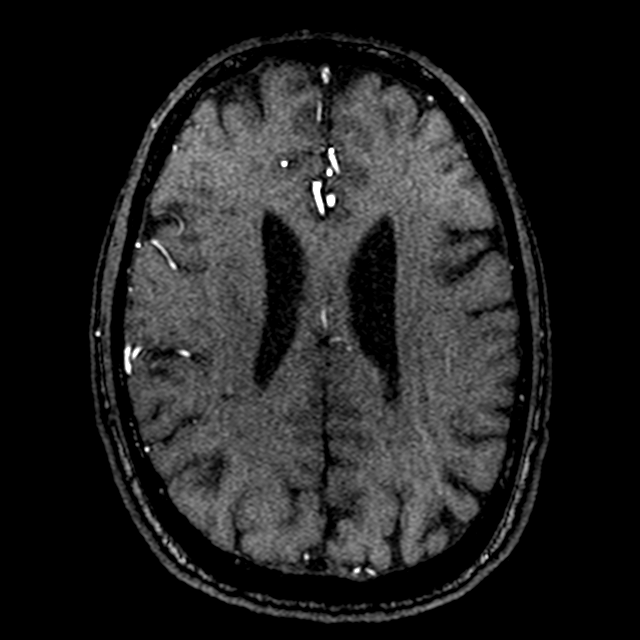

[13 of 48 positions shown; findings below may reference images not displayed]

FINDINGS: No antegrade flow an the left internal carotid artery through the
upper cervical region, skullbase and siphon region.

Right internal carotid artery is a large vessel widely patent
through the skullbase and siphon region. This vessel supplies the
right middle cerebral artery and both anterior cerebral arteries,
via a patent anterior communicating artery. Left A1 segment also
shows flow. Flow is present within a host of small vessels at the
base of the brain on the left, with some demonstration of small but
larger left MCA branches.

Both vertebral arteries are patent at the foramen magnum level. Left
vertebral artery terminates in PICA with no antegrade flow seen
beyond that. Right vertebral artery supplies a large PICA with a
patent contribution beyond that to the basilar. Basilar artery
supplies the right anterior inferior cerebellar artery and is than
occluded. Basilar tip receives it supply from patent posterior
communicating arteries. Flow is present within both superior
cerebellar and posterior cerebral arteries.
IMPRESSION: Chronic appearing occlusion of the left ICA.

Right ICA widely patent into the brain, supplying the right middle
cerebral artery territory and anterior cerebral artery territory.
Patent anterior communicating artery which allows supply of the left
anterior cerebral artery. Multiple small irregular vessels at the
base of the brain on the left consistent with the clinical history
of moyamoya syndrome. No normal appearing M1 segment. Small vessels
result in reconstituted supply to more distal left MCA branches.

The vertebral arteries are patent at the foramen magnum, supplying
large posterior inferior cerebellar arteries. Flow from the right
vertebral artery supplies the proximal basilar which shows flow as
distal as the right anterior inferior cerebellar artery but is
occluded beyond that. Bilateral posterior communicating arteries
reconstituted flow in the basilar tip, with supply to the superior
cerebellar and posterior cerebral arteries.

## 2021-02-19 ENCOUNTER — Ambulatory Visit: Payer: Medicare Other | Admitting: Nurse Practitioner

## 2021-11-25 ENCOUNTER — Ambulatory Visit (INDEPENDENT_AMBULATORY_CARE_PROVIDER_SITE_OTHER): Payer: Medicare Other | Admitting: Internal Medicine

## 2021-11-25 ENCOUNTER — Encounter: Payer: Self-pay | Admitting: Internal Medicine

## 2021-11-25 VITALS — BP 136/84 | HR 77 | Resp 16 | Ht 67.0 in | Wt 131.2 lb

## 2021-11-25 DIAGNOSIS — I675 Moyamoya disease: Secondary | ICD-10-CM

## 2021-11-25 DIAGNOSIS — Z8673 Personal history of transient ischemic attack (TIA), and cerebral infarction without residual deficits: Secondary | ICD-10-CM | POA: Diagnosis not present

## 2021-11-25 DIAGNOSIS — E782 Mixed hyperlipidemia: Secondary | ICD-10-CM | POA: Diagnosis not present

## 2021-11-25 DIAGNOSIS — M5000 Cervical disc disorder with myelopathy, unspecified cervical region: Secondary | ICD-10-CM

## 2021-11-25 DIAGNOSIS — R7303 Prediabetes: Secondary | ICD-10-CM | POA: Diagnosis not present

## 2021-11-25 DIAGNOSIS — E559 Vitamin D deficiency, unspecified: Secondary | ICD-10-CM | POA: Diagnosis not present

## 2021-11-25 NOTE — Patient Instructions (Signed)
Please take Aspirin 81 mg instead of 325 mg. ? ?Please consider getting PCV20, Shingrix and Tdap vaccines at your local pharmacy. ?

## 2021-11-25 NOTE — Assessment & Plan Note (Signed)
Patient reported ?Has had multiple CVAs in the past, last acute CVA in 2018 ?On Aspirin 325 mg QD, advised to take Aspirin 81 mg QD ?Denies Neurology or Cardiology eval ?

## 2021-11-25 NOTE — Assessment & Plan Note (Signed)
Has had cervical spine surgery in the past ?

## 2021-11-25 NOTE — Progress Notes (Signed)
? ?New Patient Office Visit ? ?Subjective:  ?Patient ID: James Marsh, male    DOB: [birth year]  Age: 66 y.o. MRN: [id number] ? ?CC:  ?Chief Complaint  ?Patient presents with  ? New Patient (Initial Visit)  ?  New patient just establishing care  ? ? ?HPI ?James Marsh is a 66 y.o. male with past medical history of moyamoya syndrome, multiple CVA, cervical myelopathy and chronic headache who presents for establishing care. ? ?He reports history of moyamoya syndrome, and has had multiple CVA in the past.  Last acute CVA was in 2018.  He has been on aspirin 325 mg daily, but has denied Plavix, statin and AC in the past.  He has chronic headache, dizziness, intermittent numbness and tingling of the face.  He currently denies any numbness or tingling.  He does not use any walking support.  He has seen neurologist at [facility] and Dr. Merlene Laughter in the past.  He prefers to avoid taking any medications.  He denies neurology eval for now. He has denied cardiac eval in the past. ? ?He has history of cervical myelopathy, and has had cervical disc surgery in the past. ? ?He and his wife live by themselves, and do not go to public places in groups. They wear gloves and masks when they go to grocery. ? ?He denies colonoscopy or cologuard. Has not any COVID vaccine. He denies pneumococcal and shingrix vaccines. ? ? ? ?Past Medical History:  ?Diagnosis Date  ? Acute CVA (cerebrovascular accident) ([place]) 01/18/2017  ? Allergy   ? pollen, foods  ? Arthritis   ? Moya moya disease   ? Neuromuscular disorder ([place])   ? Stroke [facility])   ? ? ?Past Surgical History:  ?Procedure Laterality Date  ? CERVICAL DISC SURGERY    ? VASECTOMY    ? ? ?Family History  ?Problem Relation Age of Onset  ? Cancer Mother 72  ?     ovarian  ? Heart disease Father   ? Alcohol abuse Father   ? Stroke Father   ? Cancer Sister   ?     brain  ? Cancer Brother   ?     brain  ? Cancer Brother   ?     brain  ? Heart disease Brother   ? ? ?Social History   ? ?Socioeconomic History  ? Marital status: Married  ?  Spouse name: Not on file  ? Number of children: Not on file  ? Years of education: Not on file  ? Highest education level: Not on file  ?Occupational History  ? Not on file  ?Tobacco Use  ? Smoking status: Every Day  ?  Types: Pipe  ?  Start date: 07/25/1968  ? Smokeless tobacco: Never  ? Tobacco comments:  ?  pipe smoker daily 3-4 times a day  ?Vaping Use  ? Vaping Use: Never used  ?Substance and Sexual Activity  ? Alcohol use: No  ? Drug use: No  ? Sexual activity: Not on file  ?Other Topics Concern  ? Not on file  ?Social History Narrative  ? Not on file  ? ?Social Determinants of Health  ? ?Financial Resource Strain: Not on file  ?Food Insecurity: Not on file  ?Transportation Needs: Not on file  ?Physical Activity: Not on file  ?Stress: Not on file  ?Social Connections: Not on file  ?Intimate Partner Violence: Not on file  ? ? ?ROS ?Review of Systems  ?Constitutional:  Negative  for chills and fever.  ?HENT:  Negative for congestion and sore throat.   ?Eyes:  Negative for pain and discharge.  ?Respiratory:  Negative for cough and shortness of breath.   ?Cardiovascular:  Negative for chest pain and palpitations.  ?Gastrointestinal:  Negative for constipation, diarrhea, nausea and vomiting.  ?Endocrine: Negative for polydipsia and polyuria.  ?Genitourinary:  Negative for dysuria and hematuria.  ?Musculoskeletal:  Negative for neck pain and neck stiffness.  ?Skin:  Negative for rash.  ?Neurological:  Positive for dizziness, weakness and headaches. Negative for numbness.  ?Psychiatric/Behavioral:  Negative for agitation and behavioral problems.   ? ?Objective:  ? ?Today's Vitals: BP 136/84 (BP Location: Left Arm, Patient Position: Sitting, Cuff Size: Normal)   Pulse 77   Resp 16   Ht $R'5\' 7"'AL$  (1.702 m)   Wt 131 lb 3.2 oz (59.5 kg)   SpO2 98%   BMI 20.55 kg/m?  ? ?Physical Exam ?Vitals reviewed.  ?Constitutional:   ?   General: He is not in acute distress. ?    Appearance: He is not diaphoretic.  ?HENT:  ?   Head: Normocephalic and atraumatic.  ?   Nose: Nose normal.  ?   Mouth/Throat:  ?   Mouth: Mucous membranes are moist.  ?Eyes:  ?   General: No scleral icterus. ?   Extraocular Movements: Extraocular movements intact.  ?Cardiovascular:  ?   Rate and Rhythm: Normal rate and regular rhythm.  ?   Pulses: Normal pulses.  ?   Heart sounds: Normal heart sounds. No murmur heard. ?Pulmonary:  ?   Breath sounds: Normal breath sounds. No wheezing or rales.  ?Abdominal:  ?   Palpations: Abdomen is soft.  ?   Tenderness: There is no abdominal tenderness.  ?Musculoskeletal:  ?   Cervical back: Neck supple. No tenderness.  ?   Right lower leg: No edema.  ?   Left lower leg: No edema.  ?Skin: ?   General: Skin is warm.  ?   Findings: No rash.  ?Neurological:  ?   General: No focal deficit present.  ?   Mental Status: He is alert and oriented to person, place, and time.  ?   Sensory: No sensory deficit.  ?   Motor: No weakness.  ?Psychiatric:     ?   Mood and Affect: Mood normal.     ?   Behavior: Behavior normal.  ? ? ?Assessment & Plan:  ? ?Problem List Items Addressed This Visit   ? ?  ? Cardiovascular and Mediastinum  ? Moyamoya syndrome - Primary  ?  Patient reported ?Has had multiple CVAs in the past, last acute CVA in 2018 ?On Aspirin 325 mg QD, advised to take Aspirin 81 mg QD ?Denies Neurology or Cardiology eval ? ?  ?  ? Relevant Orders  ? Lipid panel  ? Hemoglobin A1c  ? CMP14+EGFR  ? CBC with Differential/Platelet  ?  ? Nervous and Auditory  ? Cervical disc disease with myelopathy  ?  Has had cervical spine surgery in the past ? ?  ?  ?  ? Other  ? History of CVA (cerebrovascular accident)  ?  On Aspirin, needs to take 81 mg QD ?Denies statin ?Check CBC, CMP and lipid profile ? ?  ?  ? ?Other Visit Diagnoses   ? ? Prediabetes      ? Relevant Orders  ? Hemoglobin A1c  ? Mixed hyperlipidemia      ? Relevant Orders  ?  Lipid panel  ? Vitamin D deficiency      ? Relevant Orders   ? VITAMIN D 25 Hydroxy (Vit-D Deficiency, Fractures)  ? ?  ? ? ?Outpatient Encounter Medications as of 11/25/2021  ?Medication Sig  ? aspirin 325 MG EC tablet Take 325 mg by mouth daily.   ? b complex vitamins tablet Take 1 tablet by mouth daily.  ? vitamin C (ASCORBIC ACID) 500 MG tablet Take 500 mg by mouth daily.  ? vitamin E 400 UNIT capsule Take 400 Units by mouth daily.  ? ?No facility-administered encounter medications on file as of 11/25/2021.  ? ? ?Follow-up: Return in about 1 year (around 11/26/2022) for Annual physical.  ? ?Lindell Spar, MD ?

## 2021-11-25 NOTE — Assessment & Plan Note (Signed)
On Aspirin, needs to take 81 mg QD ?Denies statin ?Check CBC, CMP and lipid profile ?

## 2021-11-30 DIAGNOSIS — E559 Vitamin D deficiency, unspecified: Secondary | ICD-10-CM | POA: Diagnosis not present

## 2021-11-30 DIAGNOSIS — Z0001 Encounter for general adult medical examination with abnormal findings: Secondary | ICD-10-CM | POA: Diagnosis not present

## 2021-11-30 DIAGNOSIS — E782 Mixed hyperlipidemia: Secondary | ICD-10-CM | POA: Diagnosis not present

## 2021-11-30 DIAGNOSIS — I675 Moyamoya disease: Secondary | ICD-10-CM | POA: Diagnosis not present

## 2021-11-30 DIAGNOSIS — R7303 Prediabetes: Secondary | ICD-10-CM | POA: Diagnosis not present

## 2021-12-01 LAB — CBC WITH DIFFERENTIAL/PLATELET
Basophils Absolute: 0 10*3/uL (ref 0.0–0.2)
Basos: 1 %
EOS (ABSOLUTE): 0.6 10*3/uL — ABNORMAL HIGH (ref 0.0–0.4)
Eos: 13 %
Hematocrit: 40.4 % (ref 37.5–51.0)
Hemoglobin: 13.8 g/dL (ref 13.0–17.7)
Immature Grans (Abs): 0 10*3/uL (ref 0.0–0.1)
Immature Granulocytes: 0 %
Lymphocytes Absolute: 1.4 10*3/uL (ref 0.7–3.1)
Lymphs: 30 %
MCH: 30.6 pg (ref 26.6–33.0)
MCHC: 34.2 g/dL (ref 31.5–35.7)
MCV: 90 fL (ref 79–97)
Monocytes Absolute: 0.4 10*3/uL (ref 0.1–0.9)
Monocytes: 9 %
Neutrophils Absolute: 2.2 10*3/uL (ref 1.4–7.0)
Neutrophils: 47 %
Platelets: 205 10*3/uL (ref 150–450)
RBC: 4.51 x10E6/uL (ref 4.14–5.80)
RDW: 14.5 % (ref 11.6–15.4)
WBC: 4.6 10*3/uL (ref 3.4–10.8)

## 2021-12-01 LAB — CMP14+EGFR
ALT: 11 IU/L (ref 0–44)
AST: 16 IU/L (ref 0–40)
Albumin/Globulin Ratio: 1.9 (ref 1.2–2.2)
Albumin: 4.4 g/dL (ref 3.8–4.8)
Alkaline Phosphatase: 80 IU/L (ref 44–121)
BUN/Creatinine Ratio: 19 (ref 10–24)
BUN: 20 mg/dL (ref 8–27)
Bilirubin Total: 0.8 mg/dL (ref 0.0–1.2)
CO2: 24 mmol/L (ref 20–29)
Calcium: 9.3 mg/dL (ref 8.6–10.2)
Chloride: 104 mmol/L (ref 96–106)
Creatinine, Ser: 1.04 mg/dL (ref 0.76–1.27)
Globulin, Total: 2.3 g/dL (ref 1.5–4.5)
Glucose: 88 mg/dL (ref 70–99)
Potassium: 4.7 mmol/L (ref 3.5–5.2)
Sodium: 142 mmol/L (ref 134–144)
Total Protein: 6.7 g/dL (ref 6.0–8.5)
eGFR: 79 mL/min/{1.73_m2} (ref >59)

## 2021-12-01 LAB — LIPID PANEL
Chol/HDL Ratio: 3.5 ratio (ref 0.0–5.0)
Cholesterol, Total: 180 mg/dL (ref 100–199)
HDL: 52 mg/dL (ref >39)
LDL Chol Calc (NIH): 112 mg/dL — ABNORMAL HIGH (ref 0–99)
Triglycerides: 88 mg/dL (ref 0–149)
VLDL Cholesterol Cal: 16 mg/dL (ref 5–40)

## 2021-12-01 LAB — VITAMIN D 25 HYDROXY (VIT D DEFICIENCY, FRACTURES): Vit D, 25-Hydroxy: 16.6 ng/mL — ABNORMAL LOW (ref 30.0–100.0)

## 2021-12-01 LAB — HEMOGLOBIN A1C
Est. average glucose Bld gHb Est-mCnc: 100 mg/dL
Hgb A1c MFr Bld: 5.1 % (ref 4.8–5.6)

## 2021-12-02 ENCOUNTER — Telehealth: Payer: Self-pay

## 2021-12-02 NOTE — Telephone Encounter (Signed)
Pt advised with 2000 IU daily with verbal understanding  ?

## 2021-12-02 NOTE — Telephone Encounter (Signed)
Patient spouse called seen blood work results and noticed  Vitamin D was off and needs to know what supplement that Dr Posey Pronto recommends. Please have nurse return call. ?

## 2022-06-28 ENCOUNTER — Ambulatory Visit (HOSPITAL_COMMUNITY)
Admission: RE | Admit: 2022-06-28 | Discharge: 2022-06-28 | Disposition: A | Discharge status: Home / Self Care | Payer: Medicare Other | Source: Ambulatory Visit | Attending: Internal Medicine | Admitting: Internal Medicine

## 2022-06-28 ENCOUNTER — Ambulatory Visit (INDEPENDENT_AMBULATORY_CARE_PROVIDER_SITE_OTHER): Payer: Medicare Other | Admitting: Internal Medicine

## 2022-06-28 ENCOUNTER — Encounter: Payer: Self-pay | Admitting: Internal Medicine

## 2022-06-28 VITALS — BP 126/80 | HR 92 | Ht 67.0 in | Wt 130.4 lb

## 2022-06-28 DIAGNOSIS — J4520 Mild intermittent asthma, uncomplicated: Secondary | ICD-10-CM | POA: Insufficient documentation

## 2022-06-28 DIAGNOSIS — L821 Other seborrheic keratosis: Secondary | ICD-10-CM

## 2022-06-28 DIAGNOSIS — J9811 Atelectasis: Secondary | ICD-10-CM | POA: Diagnosis not present

## 2022-06-28 DIAGNOSIS — J90 Pleural effusion, not elsewhere classified: Secondary | ICD-10-CM | POA: Diagnosis not present

## 2022-06-28 DIAGNOSIS — Z9109 Other allergy status, other than to drugs and biological substances: Secondary | ICD-10-CM

## 2022-06-28 DIAGNOSIS — R0600 Dyspnea, unspecified: Secondary | ICD-10-CM | POA: Diagnosis not present

## 2022-06-28 MED ORDER — ALBUTEROL SULFATE HFA 108 (90 BASE) MCG/ACT IN AERS: 2.0000 | INHALATION_SPRAY | Freq: Four times a day (QID) | RESPIRATORY_TRACT | 2 refills | Status: DC | PRN

## 2022-06-28 NOTE — Patient Instructions (Signed)
Please use Albuterol as needed for shortness of breath or wheezing.  Please take Zyrtec for allergies.  Please get X-ray of chest done at [facility].

## 2022-06-29 ENCOUNTER — Telehealth: Payer: Self-pay | Admitting: Internal Medicine

## 2022-06-29 ENCOUNTER — Other Ambulatory Visit: Payer: Self-pay

## 2022-06-29 DIAGNOSIS — L821 Other seborrheic keratosis: Secondary | ICD-10-CM | POA: Insufficient documentation

## 2022-06-29 NOTE — Telephone Encounter (Signed)
Pt wife called stating that Vit D 2000u once daily was not on his medication list. Can you please add this?

## 2022-06-29 NOTE — Assessment & Plan Note (Signed)
Does not have wheezing currently on physical exam, but his history is suggestive of asthma/reactive airway disease Albuterol inhaler as needed for now If required use of rescue inhaler needed, will add maintenance inhaler Check chest x-ray to rule out infectious etiology

## 2022-06-29 NOTE — Telephone Encounter (Signed)
Added to patient med list

## 2022-06-29 NOTE — Assessment & Plan Note (Addendum)
Right knee skin lesion likely seborrheic keratosis Observe for now Advised to contact if any change in size, shape or color

## 2022-06-29 NOTE — Progress Notes (Signed)
Acute Office Visit  Subjective:    Patient ID: James Marsh, male    DOB: Jun 22, 1956, 66 y.o.   MRN: [id number]  Chief Complaint  Patient presents with   Asthma    HPI Patient is in today for complaint of intermittent dyspnea, wheezing and chest tightness, is especially worse at nighttime.  He denies any leg swelling.  He has had environmental allergies to dust, mold, pollen, etc.  He is currently not taking any antihistaminic.  He uses tobacco products through pipe, but denies smoking.  Denies any fever or chills currently.  Denies any chronic cough or hemoptysis.  Denies any recent weight loss, LAD or night sweats.  He reports remote history of asthma.  Past Medical History:  Diagnosis Date   Acute CVA (cerebrovascular accident) ([place]) 01/18/2017   Allergy    pollen, foods   Arthritis    Moya moya disease    Neuromuscular [place])    Stroke [facility])     Past Surgical History:  Procedure Laterality Date   CERVICAL DISC SURGERY     VASECTOMY      Family History  Problem Relation Age of Onset   Cancer Mother 52       ovarian   Heart disease Father    Alcohol abuse Father    Stroke Father    Cancer Sister        brain   Cancer Brother        brain   Cancer Brother        brain   Heart disease Brother     Social History   Socioeconomic History   Marital status: Married    Spouse name: Not on file   Number of children: Not on file   Years of education: Not on file   Highest education level: Not on file  Occupational History   Not on file  Tobacco Use   Smoking status: Every Day    Types: Pipe    Start date: 07/25/1968   Smokeless tobacco: Never   Tobacco comments:    pipe smoker daily 3-4 times a day  Vaping Use   Vaping Use: Never used  Substance and Sexual Activity   Alcohol use: No   Drug use: No   Sexual activity: Not on file  Other Topics Concern   Not on file  Social History Narrative   Not on file   Social Determinants of Health    Financial Resource Strain: Not on file  Food Insecurity: Not on file  Transportation Needs: Not on file  Physical Activity: Not on file  Stress: Not on file  Social Connections: Not on file  Intimate Partner Violence: Not on file    Outpatient Medications Prior to Visit  Medication Sig Dispense Refill   aspirin 325 MG EC tablet Take 325 mg by mouth daily.      b complex vitamins tablet Take 1 tablet by mouth daily.     vitamin C (ASCORBIC ACID) 500 MG tablet Take 500 mg by mouth daily.     vitamin E 400 UNIT capsule Take 400 Units by mouth daily.     No facility-administered medications prior to visit.    Allergies  Allergen Reactions   Penicillins Shortness Of Breath    Has patient had a PCN reaction causing immediate rash, facial/tongue/throat swelling, SOB or lightheadedness with hypotension: yes Has patient had a PCN reaction causing severe rash involving mucus membranes or skin necrosis: no Has patient had  a PCN reaction that required hospitalization: yes Has patient had a PCN reaction occurring within the last 10 years: no If all of the above answers are "NO", then may proceed with Cephalosporin use.    No [employer] Food Allergies     Patient allergic to nuts and dairy.    Review of Systems  Constitutional:  Negative for chills and fever.  HENT:  Negative for congestion and sore throat.   Eyes:  Negative for pain and discharge.  Respiratory:  Positive for chest tightness, shortness of breath and wheezing. Negative for cough.   Cardiovascular:  Negative for chest pain and palpitations.  Gastrointestinal:  Negative for constipation, diarrhea, nausea and vomiting.  Endocrine: Negative for polydipsia and polyuria.  Genitourinary:  Negative for dysuria and hematuria.  Musculoskeletal:  Negative for neck pain and neck stiffness.  Skin:  Negative for rash.       Skin lesion over right knee  Neurological:  Positive for weakness. Negative for numbness.   Psychiatric/Behavioral:  Negative for agitation and behavioral problems.        Objective:    Physical Exam Vitals reviewed.  Constitutional:      General: He is not in acute distress.    Appearance: He is not diaphoretic.  HENT:     Head: Normocephalic and atraumatic.     Nose: Nose normal.     Mouth/Throat:     Mouth: Mucous membranes are moist.  Eyes:     General: No scleral icterus.    Extraocular Movements: Extraocular movements intact.  Cardiovascular:     Rate and Rhythm: Normal rate and regular rhythm.     Pulses: Normal pulses.     Heart sounds: Normal heart sounds. No murmur heard. Pulmonary:     Breath sounds: Normal breath sounds. No wheezing or rales.  Musculoskeletal:     Cervical back: Neck supple. No tenderness.     Right lower leg: No edema.     Left lower leg: No edema.  Skin:    General: Skin is warm.     Findings: No rash.     Comments: Hyperkeratotic plaque over right knee, brownish in color  Neurological:     General: No focal deficit present.     Mental Status: He is alert and oriented to person, place, and time.  Psychiatric:        Mood and Affect: Mood normal.        Behavior: Behavior normal.     BP 126/80 (BP Location: Right Arm, Patient Position: Sitting, Cuff Size: Normal)   Pulse 92   Ht '5\' 7"'$  (1.702 m)   Wt 130 lb 6.4 oz (59.1 kg)   SpO2 97%   BMI 20.42 kg/m  Wt Readings from Last 3 Encounters:  06/28/22 130 lb 6.4 oz (59.1 kg)  11/25/21 131 lb 3.2 oz (59.5 kg)  02/01/17 131 lb 1.3 oz (59.5 kg)        Assessment & Plan:   Problem List Items Addressed This Visit       Respiratory   Mild intermittent asthma without complication - Primary    Does not have wheezing currently on physical exam, but his history is suggestive of asthma/reactive airway disease Albuterol inhaler as needed for now If required use of rescue inhaler needed, will add maintenance inhaler Check chest x-ray to rule out infectious etiology       Relevant Medications   albuterol (VENTOLIN HFA) 108 (90 Base) MCG/ACT inhaler   Other Relevant Orders  DG Chest 2 View     Musculoskeletal and Integument   Seborrheic keratosis    Right knee skin lesion likely seborrheic keratosis Observe for now Advised to contact if any change in size, shape or color        Other   Environmental allergies    Advised to take Zyrtec        Meds ordered this encounter  Medications   albuterol (VENTOLIN HFA) 108 (90 Base) MCG/ACT inhaler    Sig: Inhale 2 puffs into the lungs every 6 (six) hours as needed for wheezing or shortness of breath.    Dispense:  18 g    Refill:  2    Okay to substitute to generic/formulary Albuterol.     Lindell Spar, MD

## 2022-06-29 NOTE — Assessment & Plan Note (Signed)
Advised to take Zyrtec

## 2022-07-20 ENCOUNTER — Encounter: Payer: Self-pay | Admitting: Internal Medicine

## 2022-07-20 ENCOUNTER — Ambulatory Visit (INDEPENDENT_AMBULATORY_CARE_PROVIDER_SITE_OTHER): Payer: Medicare Other | Admitting: Internal Medicine

## 2022-07-20 VITALS — BP 116/75 | HR 97 | Ht 67.0 in | Wt 129.4 lb

## 2022-07-20 DIAGNOSIS — I440 Atrioventricular block, first degree: Secondary | ICD-10-CM | POA: Diagnosis not present

## 2022-07-20 DIAGNOSIS — I509 Heart failure, unspecified: Secondary | ICD-10-CM | POA: Insufficient documentation

## 2022-07-20 DIAGNOSIS — J4520 Mild intermittent asthma, uncomplicated: Secondary | ICD-10-CM

## 2022-07-20 DIAGNOSIS — J810 Acute pulmonary edema: Secondary | ICD-10-CM | POA: Insufficient documentation

## 2022-07-20 DIAGNOSIS — F17211 Nicotine dependence, cigarettes, in remission: Secondary | ICD-10-CM

## 2022-07-20 MED ORDER — FUROSEMIDE 20 MG PO TABS: 20.0000 mg | ORAL_TABLET | Freq: Every day | ORAL | 0 refills | Status: DC

## 2022-07-20 NOTE — Assessment & Plan Note (Addendum)
Remote history of smoking cigarettes, but currently uses tobacco products through pipe Check low-dose CT chest

## 2022-07-20 NOTE — Progress Notes (Signed)
Acute Office Visit  Subjective:    Patient ID: James Marsh, male    DOB: Apr 22, 1956, 66 y.o.   MRN: [id number]  Chief Complaint  Patient presents with   Shortness of Breath    Patient having shortness of breath, rapid heart rate, decrease in oxygen levels, started on 07/04/2022 after using a rescue inhaler     HPI Patient is in today for complaint of intermittent dyspnea, wheezing and chest tightness, is especially worse at nighttime for the last 2 months. He was last seen on 12/05 for the same concern.  He had CXR, which showed mild right-sided pleural effusion and signs of mild vascular congestion.  He denies any leg swelling.  Today, he reports orthopnea and PND for the last 2 weeks.  His wife has tried to check his O2 saturation, which ranges around 92%-94% when he has dyspnea and palpitations.  His heart rate has been around 120 at times.  He has had environmental allergies to dust, mold, pollen, etc.  He is currently not taking any antihistaminic.  He uses tobacco products through pipe, but denies smoking.  Denies any fever or chills currently.  Denies any chronic cough or hemoptysis.  Denies any recent weight loss, LAD or night sweats.  He reports remote history of asthma.  He was given albuterol inhaler in the last visit, which has helped him with dyspnea at times.  He reports palpitations when he tries to use albuterol inhaler.  Past Medical History:  Diagnosis Date   Acute CVA (cerebrovascular accident) ([place]) 01/18/2017   Allergy    pollen, foods   Arthritis    Moya moya disease    Neuromuscular disorder ([place])    Stroke [facility])     Past Surgical History:  Procedure Laterality Date   CERVICAL DISC SURGERY     VASECTOMY      Family History  Problem Relation Age of Onset   Cancer Mother 73       ovarian   Heart disease Father    Alcohol abuse Father    Stroke Father    Cancer Sister        brain   Cancer Brother        brain   Cancer Brother        brain    Heart disease Brother     Social History   Socioeconomic History   Marital status: Married    Spouse name: Not on file   Number of children: Not on file   Years of education: Not on file   Highest education level: Not on file  Occupational History   Not on file  Tobacco Use   Smoking status: Every Day    Types: Pipe    Start date: 07/25/1968   Smokeless tobacco: Never   Tobacco comments:    pipe smoker daily 3-4 times a day  Vaping Use   Vaping Use: Never used  Substance and Sexual Activity   Alcohol use: No   Drug use: No   Sexual activity: Not on file  Other Topics Concern   Not on file  Social History Narrative   Not on file   Social Determinants of Health   Financial Resource Strain: Not on file  Food Insecurity: Not on file  Transportation Needs: Not on file  Physical Activity: Not on file  Stress: Not on file  Social Connections: Not on file  Intimate Partner Violence: Not on file    Outpatient Medications Prior to  Visit  Medication Sig Dispense Refill   albuterol (VENTOLIN HFA) 108 (90 Base) MCG/ACT inhaler Inhale 2 puffs into the lungs every 6 (six) hours as needed for wheezing or shortness of breath. 18 g 2   aspirin 325 MG EC tablet Take 325 mg by mouth daily.      b complex vitamins tablet Take 1 tablet by mouth daily.     Cholecalciferol (VITAMIN D) 50 MCG (2000 UT) tablet Take 2,000 Units by mouth daily.     vitamin C (ASCORBIC ACID) 500 MG tablet Take 500 mg by mouth daily.     vitamin E 400 UNIT capsule Take 400 Units by mouth daily.     No facility-administered medications prior to visit.    Allergies  Allergen Reactions   Penicillins Shortness Of Breath    Has patient had a PCN reaction causing immediate rash, facial/tongue/throat swelling, SOB or lightheadedness with hypotension: yes Has patient had a PCN reaction causing severe rash involving mucus membranes or skin necrosis: no Has patient had a PCN reaction that required hospitalization:  yes Has patient had a PCN reaction occurring within the last 10 years: no If all of the above answers are "NO", then may proceed with Cephalosporin use.    No [employer] Food Allergies     Patient allergic to nuts and dairy.    Review of Systems  Constitutional:  Negative for chills and fever.  HENT:  Negative for congestion and sore throat.   Eyes:  Negative for pain and discharge.  Respiratory:  Positive for chest tightness, shortness of breath and wheezing. Negative for cough.   Cardiovascular:  Negative for chest pain and palpitations.  Gastrointestinal:  Negative for constipation, diarrhea, nausea and vomiting.  Endocrine: Negative for polydipsia and polyuria.  Genitourinary:  Negative for dysuria and hematuria.  Musculoskeletal:  Negative for neck pain and neck stiffness.  Skin:  Negative for rash.       Skin lesion over right knee  Neurological:  Positive for weakness. Negative for numbness.  Psychiatric/Behavioral:  Negative for agitation and behavioral problems.        Objective:    Physical Exam Vitals reviewed.  Constitutional:      General: He is not in acute distress.    Appearance: He is not diaphoretic.  HENT:     Head: Normocephalic and atraumatic.     Nose: Nose normal.     Mouth/Throat:     Mouth: Mucous membranes are moist.  Eyes:     General: No scleral icterus.    Extraocular Movements: Extraocular movements intact.  Cardiovascular:     Rate and Rhythm: Normal rate and regular rhythm.     Pulses: Normal pulses.     Heart sounds: Normal heart sounds. No murmur heard. Pulmonary:     Breath sounds: Normal breath sounds. No wheezing.     Comments: Crackles on bilateral lower lung fields Musculoskeletal:     Cervical back: Neck supple. No tenderness.     Right lower leg: No edema.     Left lower leg: No edema.  Skin:    General: Skin is warm.     Findings: No rash.     Comments: Hyperkeratotic plaque over right knee, brownish in color   Neurological:     General: No focal deficit present.     Mental Status: He is alert and oriented to person, place, and time.  Psychiatric:        Mood and Affect: Mood normal.  Behavior: Behavior normal.     BP 116/75 (BP Location: Left Arm, Patient Position: Sitting, Cuff Size: Normal)   Pulse 97   Ht '5\' 7"'$  (1.702 m)   Wt 129 lb 6.4 oz (58.7 kg)   SpO2 96%   BMI 20.27 kg/m  Wt Readings from Last 3 Encounters:  07/20/22 129 lb 6.4 oz (58.7 kg)  06/28/22 130 lb 6.4 oz (59.1 kg)  11/25/21 131 lb 3.2 oz (59.5 kg)        Assessment & Plan:   Problem List Items Addressed This Visit       Cardiovascular and Mediastinum   Acute congestive heart failure (HCC)    Currently has orthopnea and PND, concern for acute CHF Added Lasix 20 mg QD for now, as he is naive to diuretic, would start with lower dose EKG: Sinus rhythm. LVH. 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 Previous Echo in 2018 reviewed Check BMP and BNP Referred to cardiology - will need updated Echo      Relevant Medications   furosemide (LASIX) 20 MG tablet   Other Relevant Orders   Basic Metabolic Panel (BMET)   B Nat Peptide   EKG 12-Lead (Completed)   First degree heart block    EKG: Sinus rhythm. LVH. 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  LVH and LAFB are comparable to previous EKG      Relevant Medications   furosemide (LASIX) 20 MG tablet     Respiratory   Mild intermittent asthma without complication    Does not have wheezing currently on physical exam, but his history is suggestive of asthma/reactive airway disease Albuterol inhaler as needed for now If required use of rescue inhaler needed, will add maintenance inhaler      Acute pulmonary edema (HCC) - Primary    Signs of vascular congestion/pulmonary edema noted on chest x-ray It was mild and seems he does not have h/o CHF, did not add diuretic in the last visit Currently has orthopnea and PND, concern for acute  CHF Added Lasix 20 mg QD for now, as he is naive to diuretic, would start with lower dose      Relevant Medications   furosemide (LASIX) 20 MG tablet   Other Relevant Orders   Ambulatory referral to Cardiology   Basic Metabolic Panel (BMET)   B Nat Peptide     Other   Cigarette nicotine dependence in remission    Remote history of smoking cigarettes, but currently uses tobacco products through pipe Check low-dose CT chest      Relevant Orders   CT CHEST LUNG CANCER SCREENING LOW DOSE WO CONTRAST     Meds ordered this encounter  Medications   furosemide (LASIX) 20 MG tablet    Sig: Take 1 tablet (20 mg total) by mouth daily.    Dispense:  15 tablet    Refill:  0     Jonmichael Beadnell Keith Rake, MD

## 2022-07-20 NOTE — Patient Instructions (Signed)
Please start taking Lasix as prescribed.  Please continue to use Albuterol as needed for shortness of breath or wheezing.

## 2022-07-20 NOTE — Assessment & Plan Note (Signed)
Does not have wheezing currently on physical exam, but his history is suggestive of asthma/reactive airway disease Albuterol inhaler as needed for now If required use of rescue inhaler needed, will add maintenance inhaler

## 2022-07-20 NOTE — Assessment & Plan Note (Signed)
EKG: Sinus rhythm. LVH. 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  LVH and LAFB are comparable to previous EKG

## 2022-07-20 NOTE — Assessment & Plan Note (Signed)
Signs of vascular congestion/pulmonary edema noted on chest x-ray It was mild and seems he does not have h/o CHF, did not add diuretic in the last visit Currently has orthopnea and PND, concern for acute CHF Added Lasix 20 mg QD for now, as he is naive to diuretic, would start with lower dose

## 2022-07-20 NOTE — Assessment & Plan Note (Addendum)
Currently has orthopnea and PND, concern for acute CHF Added Lasix 20 mg QD for now, as he is naive to diuretic, would start with lower dose EKG: Sinus rhythm. LVH. 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 Previous Echo in 2018 reviewed Check BMP and BNP Referred to cardiology - will need updated Echo

## 2022-07-22 ENCOUNTER — Encounter: Payer: Self-pay | Admitting: Internal Medicine

## 2022-07-22 ENCOUNTER — Ambulatory Visit: Payer: Medicare Other | Attending: Internal Medicine | Admitting: Internal Medicine

## 2022-07-22 ENCOUNTER — Other Ambulatory Visit: Payer: Self-pay | Admitting: Internal Medicine

## 2022-07-22 VITALS — BP 122/90 | HR 94 | Ht 67.0 in | Wt 128.6 lb

## 2022-07-22 DIAGNOSIS — I509 Heart failure, unspecified: Secondary | ICD-10-CM | POA: Insufficient documentation

## 2022-07-22 DIAGNOSIS — I350 Nonrheumatic aortic (valve) stenosis: Secondary | ICD-10-CM | POA: Insufficient documentation

## 2022-07-22 LAB — BASIC METABOLIC PANEL
BUN/Creatinine Ratio: 18 (ref 10–24)
BUN: 20 mg/dL (ref 8–27)
CO2: 22 mmol/L (ref 20–29)
Calcium: 9.5 mg/dL (ref 8.6–10.2)
Chloride: 103 mmol/L (ref 96–106)
Creatinine, Ser: 1.13 mg/dL (ref 0.76–1.27)
Glucose: 87 mg/dL (ref 70–99)
Potassium: 4.7 mmol/L (ref 3.5–5.2)
Sodium: 141 mmol/L (ref 134–144)
eGFR: 71 mL/min/{1.73_m2} (ref >59)

## 2022-07-22 LAB — BRAIN NATRIURETIC PEPTIDE: BNP: 976.5 pg/mL — ABNORMAL HIGH (ref 0.0–100.0)

## 2022-07-22 MED ORDER — POTASSIUM CHLORIDE CRYS ER 10 MEQ PO TBCR: 10.0000 meq | EXTENDED_RELEASE_TABLET | Freq: Every day | ORAL | 3 refills | Status: DC

## 2022-07-22 MED ORDER — FUROSEMIDE 20 MG PO TABS: 20.0000 mg | ORAL_TABLET | Freq: Two times a day (BID) | ORAL | 3 refills | Status: DC

## 2022-07-22 NOTE — Patient Instructions (Signed)
Medication Instructions:  Start Lasix 20 mg twice daily Start Potassium 10 mEq daily  *If you need a refill on your cardiac medications before your next appointment, please call your pharmacy*   Lab Work: Complete lab work on Tuesday 07/26/2022 BMET  If you have labs (blood work) drawn today and your tests are completely normal, you will receive your results only by: [place] (if you have MyChart) OR A paper copy in the mail If you have any lab test that is abnormal or we need to change your treatment, we will call you to review the results.   Testing/Procedures: Your physician has requested that you have an echocardiogram. Echocardiography is a painless test that uses sound waves to create images of your heart. It provides your doctor with information about the size and shape of your heart and how well your heart's chambers and valves are working. This procedure takes approximately one hour. There are no restrictions for this procedure. Please do NOT wear cologne, perfume, aftershave, or lotions (deodorant is allowed). Please arrive 15 minutes prior to your appointment time.    Follow-Up: At [facility], you and your health needs are our priority.  As part of our continuing mission to provide you with exceptional heart care, we have created designated Provider Care Teams.  These Care Teams include your primary Cardiologist (physician) and Advanced Practice Providers (APPs -  Physician Assistants and Nurse Practitioners) who all work together to provide you with the care you need, when you need it.  We recommend signing up for the patient portal called "MyChart".  Sign up information is provided on this After Visit Summary.  MyChart is used to connect with patients for Virtual Visits (Telemedicine).  Patients are able to view lab/test results, encounter notes, upcoming appointments, etc.  Non-urgent messages can be sent to your provider as well.   To learn more about what  you can do with MyChart, go to [email or web address].    Your next appointment:   1 month(s)  The format for your next appointment:   In Person  Provider:   Claudina Lick, MD    Other Instructions   Important Information About Sugar

## 2022-07-22 NOTE — Progress Notes (Signed)
Cardiology Office Note  Date: 07/22/2022   ID: James Marsh, DOB [birth year], MRN [id number]  PCP:  Lindell Spar, MD  Cardiologist:  None Electrophysiologist:  None   Reason for Office Visit: Evaluation of congestive heart failure at the request of Dr. Posey Pronto   History of Present Illness: James Marsh is a 66 y.o. male known to have moyamoya disease, history of CVA was referred to cardiology clinic for evaluation of heart failure symptoms.  Patient has been having shortness of breath mainly at nighttime associated with productive cough producing clear sputum and chest discomfort x 2 months but has been worsening for the last 1 month. He does have shortness of breath with exertion but only with extreme strenuous activities and not with ADLs. He does not use any pillows at nighttime to sleep but in the last 1 month has used some pillows to sleep comfortably.  Initially he tried inhalers with no relief of his SOB symptoms. Later, he tried drinking coffee in the nighttime but had minimal relief of his SOB symptoms. He does have chest discomfort associated with SOB but denied any isolated chest pain episodes, denied syncope/dizziness/lightheadedness, LE swelling, palpitations.  He had PCP visit 2 days ago during when BMP was normal and BNP was elevated at 976. No prior history of MI/PCI/CABG.  No prior ischemic evaluation.  He underwent echocardiogram in 2018 which showed normal LVEF, 50 to 55% and mild aortic valve stenosis.  Past Medical History:  Diagnosis Date   Acute CVA (cerebrovascular accident) ([place]) 01/18/2017   Allergy    pollen, foods   Arthritis    Moya moya disease    Neuromuscular disorder ([place])    Stroke [facility])     Past Surgical History:  Procedure Laterality Date   CERVICAL DISC SURGERY     VASECTOMY      Current Outpatient Medications  Medication Sig Dispense Refill   albuterol (VENTOLIN HFA) 108 (90 Base) MCG/ACT inhaler Inhale 2 puffs into the lungs  every 6 (six) hours as needed for wheezing or shortness of breath. 18 g 2   aspirin 325 MG EC tablet Take 325 mg by mouth daily.      b complex vitamins tablet Take 1 tablet by mouth daily.     Cholecalciferol (VITAMIN D) 50 MCG (2000 UT) tablet Take 2,000 Units by mouth daily.     furosemide (LASIX) 20 MG tablet Take 1 tablet (20 mg total) by mouth 2 (two) times daily. 180 tablet 3   potassium chloride (KLOR-CON M) 10 MEQ tablet Take 1 tablet (10 mEq total) by mouth daily. 90 tablet 3   vitamin C (ASCORBIC ACID) 500 MG tablet Take 500 mg by mouth daily.     vitamin E 400 UNIT capsule Take 400 Units by mouth daily.     No current facility-administered medications for this visit.   Allergies:  Penicillins and No healthtouch food allergies   Social History: The patient  reports that he has been smoking pipe. He started smoking about 54 years ago. He has never used smokeless tobacco. He reports that he does not drink alcohol and does not use drugs.   Family History: The patient's family history includes Alcohol abuse in his father; Cancer in his brother, brother, and sister; Cancer (age of onset: 74) in his mother; Heart disease in his brother and father; Stroke in his father.   ROS:  Please see the history of present illness. Otherwise, complete review of systems is positive for  none.  All other systems are reviewed and negative.   Physical Exam: VS:  BP (!) 122/90   Pulse 94   Ht '5\' 7"'$  (1.702 m)   Wt 128 lb 9.6 oz (58.3 kg)   SpO2 96%   BMI 20.14 kg/m , BMI Body mass index is 20.14 kg/m.  Wt Readings from Last 3 Encounters:  07/22/22 128 lb 9.6 oz (58.3 kg)  07/20/22 129 lb 6.4 oz (58.7 kg)  06/28/22 130 lb 6.4 oz (59.1 kg)    General: Patient appears comfortable at rest. HEENT: Conjunctiva and lids normal, oropharynx clear with moist mucosa. Neck: JVD elevated Lungs: Clear to auscultation, nonlabored breathing at rest. Cardiac: Regular rate and rhythm, no S3 or significant  systolic murmur, no pericardial rub. Abdomen: Soft, nontender, no hepatomegaly, bowel sounds present, no guarding or rebound. Extremities: No pitting edema, distal pulses 2+. Skin: Warm and dry. Musculoskeletal: No kyphosis. Neuropsychiatric: Alert and oriented x3, affect grossly appropriate.  ECG:  An ECG dated 07/22/22 was personally reviewed today and demonstrated:  NSR and first degree AVB  Recent Labwork: 11/30/2021: ALT 11; AST 16; Hemoglobin 13.8; Platelets 205 07/20/2022: BNP 976.5; BUN 20; Creatinine, Ser 1.13; Potassium 4.7; Sodium 141     Component Value Date/Time   CHOL 180 11/30/2021 0837   TRIG 88 11/30/2021 0837   HDL 52 11/30/2021 0837   CHOLHDL 3.5 11/30/2021 0837   CHOLHDL 5.1 01/19/2017 0559   VLDL 13 01/19/2017 0559   LDLCALC 112 (H) 11/30/2021 0837    Other Studies Reviewed Today: Echo from 2018 LVEF 50 to 55% Mild aortic valve stenosis  Assessment and Plan: Patient is a 66 year old M known to have moyamoya disease, history of CVA was referred to cardiology clinic for her heart failure symptoms evaluation.  # Congestive heart failure -Patient had PND and new pillow orthopnea x 2 months consistent with congestive heart failure. Obtain echocardiogram to rule out new onset cardiomyopathy. If his LVEF is reduced, patient prefers to undergo CTA cardiac for ischemia evaluation. -Increase Lasix frequency, p.o. Lasix 20 mg twice daily. Start potassium supplements 10 mEq once a day. Obtain BMET on 07/26/2022.  # Mild aortic valve stenosis (per 2018 echo) -Obtain 2D echocardiogram  # First-degree heart block -Reassurance.  No intervention needed. -Not on AV nodal agents.  # Moyamoya disease # History of CVA -Continue aspirin 325 mg once daily, per PCP. -Management per PCP  I have spent a total of 41 minutes with patient reviewing chart, EKGs, labs and examining patient as well as establishing an assessment and plan that was discussed with the patient.  > 50% of  time was spent in direct patient care.     Medication Adjustments/Labs and Tests Ordered: Current medicines are reviewed at length with the patient today.  Concerns regarding medicines are outlined above.   Tests Ordered: Orders Placed This Encounter  Procedures   Basic metabolic panel   EKG [id number]   ECHOCARDIOGRAM COMPLETE    Medication Changes: Meds ordered this encounter  Medications   furosemide (LASIX) 20 MG tablet    Sig: Take 1 tablet (20 mg total) by mouth 2 (two) times daily.    Dispense:  180 tablet    Refill:  3   potassium chloride (KLOR-CON M) 10 MEQ tablet    Sig: Take 1 tablet (10 mEq total) by mouth daily.    Dispense:  90 tablet    Refill:  3    Disposition:  Follow up  one month  Signed,  Edmonia Gonser Fidel Levy, MD, 07/22/2022 8:31 AM    [place] at [facility] [address] [place], [place] [address]

## 2022-07-26 ENCOUNTER — Other Ambulatory Visit (HOSPITAL_COMMUNITY)
Admission: RE | Admit: 2022-07-26 | Discharge: 2022-07-26 | Disposition: A | Discharge status: Home / Self Care | Payer: Medicare Other | Source: Ambulatory Visit | Attending: Internal Medicine | Admitting: Internal Medicine

## 2022-07-26 ENCOUNTER — Telehealth: Payer: Self-pay | Admitting: Internal Medicine

## 2022-07-26 DIAGNOSIS — I509 Heart failure, unspecified: Secondary | ICD-10-CM | POA: Diagnosis not present

## 2022-07-26 LAB — BASIC METABOLIC PANEL
Anion gap: 10 (ref 5–15)
BUN: 22 mg/dL (ref 8–23)
CO2: 29 mmol/L (ref 22–32)
Calcium: 9.3 mg/dL (ref 8.9–10.3)
Chloride: 97 mmol/L — ABNORMAL LOW (ref 98–111)
Creatinine, Ser: 1.21 mg/dL (ref 0.61–1.24)
GFR, Estimated: >60 mL/min (ref >60)
Glucose, Bld: 90 mg/dL (ref 70–99)
Potassium: 4.5 mmol/L (ref 3.5–5.1)
Sodium: 136 mmol/L (ref 135–145)

## 2022-07-26 NOTE — Telephone Encounter (Signed)
Pt weight in office on 12/29- 128 lb Pt weight at home on 1/2- 122.8 lb Pt weight today in office (checked for pt)- 123 lb

## 2022-07-26 NOTE — Telephone Encounter (Signed)
Patient notified and verbalized understanding. 

## 2022-07-26 NOTE — Telephone Encounter (Signed)
Patient came to hospital to have blood drawn. Wife and [name] is concerned about him weight he has lost 5lbs according to their scales at home. She would like to see if we could weigh him here to see if her scales is off. He was seen on the 29th by Mallipeddi.

## 2022-08-02 ENCOUNTER — Ambulatory Visit (HOSPITAL_COMMUNITY)
Admission: RE | Admit: 2022-08-02 | Discharge: 2022-08-02 | Disposition: A | Discharge status: Home / Self Care | Payer: Medicare Other | Source: Ambulatory Visit | Attending: Internal Medicine | Admitting: Internal Medicine

## 2022-08-02 DIAGNOSIS — I509 Heart failure, unspecified: Secondary | ICD-10-CM

## 2022-08-02 LAB — ECHOCARDIOGRAM COMPLETE
AR max vel: 1.1 cm2
AV Area VTI: 1 cm2
AV Area mean vel: 1.19 cm2
AV Mean grad: 6 mmHg
AV Peak grad: 13.5 mmHg
Ao pk vel: 1.84 m/s
Area-P 1/2: 4.68 cm2
Calc EF: 25.3 %
MV M vel: 4.2 m/s
MV Peak grad: 70.6 mmHg
P 1/2 time: 459 msec
Radius: 0.6 cm
S' Lateral: 5.3 cm
Single Plane A2C EF: 32.4 %
Single Plane A4C EF: 20.9 %

## 2022-08-02 NOTE — Progress Notes (Signed)
*  PRELIMINARY RESULTS* Echocardiogram 2D Echocardiogram has been performed.  James Marsh 08/02/2022, 10:38 AM

## 2022-08-05 ENCOUNTER — Telehealth: Payer: Self-pay

## 2022-08-05 NOTE — Telephone Encounter (Signed)
-----  Message from Chalmers Guest, MD sent at 08/05/2022  6:24 AM EST ----- Normal kidney function

## 2022-08-05 NOTE — Telephone Encounter (Signed)
-----  Message from Chalmers Guest, MD sent at 08/05/2022  6:23 AM EST ----- Heart pumping function is weak and has moderate aortic valve stenosis. Please schedule to see me next week for GDMT initiation (overbook okay). Keep appointment in 02/24 as well.

## 2022-08-05 NOTE — Telephone Encounter (Signed)
Patient/patient spouse notified and verbalized understanding.

## 2022-08-08 ENCOUNTER — Ambulatory Visit: Payer: Medicare Other | Attending: Internal Medicine | Admitting: Internal Medicine

## 2022-08-08 ENCOUNTER — Encounter: Payer: Self-pay | Admitting: Internal Medicine

## 2022-08-08 VITALS — BP 114/64 | HR 94 | Ht 67.0 in | Wt 125.4 lb

## 2022-08-08 DIAGNOSIS — I509 Heart failure, unspecified: Secondary | ICD-10-CM | POA: Insufficient documentation

## 2022-08-08 DIAGNOSIS — I351 Nonrheumatic aortic (valve) insufficiency: Secondary | ICD-10-CM | POA: Diagnosis not present

## 2022-08-08 NOTE — Patient Instructions (Signed)
Medication Instructions:  Your physician recommends that you continue on your current medications as directed. Please refer to the Current Medication list given to you today.   Labwork: None  Testing/Procedures: Your physician has requested that you have an echocardiogram. Echocardiography is a painless test that uses sound waves to create images of your heart. It provides your doctor with information about the size and shape of your heart and how well your heart's chambers and valves are working. This procedure takes approximately one hour. There are no restrictions for this procedure. Please do NOT wear cologne, perfume, aftershave, or lotions (deodorant is allowed). Please arrive 15 minutes prior to your appointment time.   Follow-Up: Follow up with Dr. Dellia Cloud in 6 months.   Any Other Special Instructions Will Be Listed Below (If Applicable).     If you need a refill on your cardiac medications before your next appointment, please call your pharmacy.

## 2022-08-08 NOTE — Progress Notes (Signed)
Cardiology Office Note  Date: 08/08/2022   ID: SOU NOHR, DOB 12-Dec-1955, MRN [id number]  PCP:  Lindell Spar, MD  Cardiologist:  None Electrophysiologist:  None   Reason for Office Visit: For abnormal echo   History of Present Illness: James Marsh is a 67 y.o. male known to have moyamoya disease, history of CVA presented to cardiology clinic after echo resulted abnormal.  Patient was initially referred to cardiology clinic for evaluation of CHF. Patient has been having DOE and leg swelling x 2 months for which his PCP started him on Lasix 20 mg once daily which was later increased to Lasix 20 mg twice daily with significant improvement in his symptoms. No prior history of MI/PCI/CABG. Echocardiogram was performed in 08/02/2022 that showed LVEF 20 to 25%, moderate LVH, grade 2 DD, LA severely dilated, mild MR, moderate aortic valve stenosis and mild AR. He presented today for follow-up visit. Patient and his wife reported that they have been under a lot of stress for the last 2 to 3 years which might have contributed to the new onset cardiomyopathy. Patient and his wife prefer not to be on any pharmaceuticals for new onset cardiomyopathy management. Patient also did not any prefer any ischemia evaluation as he did not want PCI or CABG. He continues to deny any angina, DOE, lightheadedness, dizziness, syncope, palpitations, leg swelling.  Past Medical History:  Diagnosis Date   Acute CVA (cerebrovascular accident) ([place]) 01/18/2017   Allergy    pollen, foods   Arthritis    Moya moya disease    Neuromuscular disorder ([place])    Stroke [facility])     Past Surgical History:  Procedure Laterality Date   CERVICAL DISC SURGERY     VASECTOMY      Current Outpatient Medications  Medication Sig Dispense Refill   albuterol (VENTOLIN HFA) 108 (90 Base) MCG/ACT inhaler Inhale 2 puffs into the lungs every 6 (six) hours as needed for wheezing or shortness of breath. 18 g 2   aspirin 325  MG EC tablet Take 325 mg by mouth daily.      b complex vitamins tablet Take 1 tablet by mouth daily.     Cholecalciferol (VITAMIN D) 50 MCG (2000 UT) tablet Take 2,000 Units by mouth daily.     furosemide (LASIX) 20 MG tablet Take 1 tablet (20 mg total) by mouth 2 (two) times daily. 180 tablet 3   potassium chloride (KLOR-CON M) 10 MEQ tablet Take 1 tablet (10 mEq total) by mouth daily. 90 tablet 3   vitamin C (ASCORBIC ACID) 500 MG tablet Take 500 mg by mouth daily.     vitamin E 400 UNIT capsule Take 400 Units by mouth daily.     No current facility-administered medications for this visit.   Allergies:  Penicillins and No healthtouch food allergies   Social History: The patient  reports that he has been smoking pipe. He started smoking about 54 years ago. He has never used smokeless tobacco. He reports that he does not drink alcohol and does not use drugs.   Family History: The patient's family history includes Alcohol abuse in his father; Cancer in his brother, brother, and sister; Cancer (age of onset: 82) in his mother; Heart disease in his brother and father; Stroke in his father.   ROS:  Please see the history of present illness. Otherwise, complete review of systems is positive for none.  All other systems are reviewed and negative.   Physical Exam: VS:  BP 114/64   Pulse 94   Ht '5\' 7"'$  (1.702 m)   Wt 125 lb 6.4 oz (56.9 kg)   SpO2 98%   BMI 19.64 kg/m , BMI Body mass index is 19.64 kg/m.  Wt Readings from Last 3 Encounters:  08/08/22 125 lb 6.4 oz (56.9 kg)  07/22/22 128 lb 9.6 oz (58.3 kg)  07/20/22 129 lb 6.4 oz (58.7 kg)    General: Patient appears comfortable at rest. HEENT: Conjunctiva and lids normal, oropharynx clear with moist mucosa. Neck: JVD elevated Lungs: Clear to auscultation, nonlabored breathing at rest. Cardiac: Regular rate and rhythm, no S3 or significant systolic murmur, no pericardial rub. Abdomen: Soft, nontender, no hepatomegaly, bowel sounds  present, no guarding or rebound. Extremities: No pitting edema, distal pulses 2+. Skin: Warm and dry. Musculoskeletal: No kyphosis. Neuropsychiatric: Alert and oriented x3, affect grossly appropriate.  ECG:  An ECG dated 07/22/22 was personally reviewed today and demonstrated:  NSR and first degree AVB  Recent Labwork: 11/30/2021: ALT 11; AST 16; Hemoglobin 13.8; Platelets 205 07/20/2022: BNP 976.5 07/26/2022: BUN 22; Creatinine, Ser 1.21; Potassium 4.5; Sodium 136     Component Value Date/Time   CHOL 180 11/30/2021 0837   TRIG 88 11/30/2021 0837   HDL 52 11/30/2021 0837   CHOLHDL 3.5 11/30/2021 0837   CHOLHDL 5.1 01/19/2017 0559   VLDL 13 01/19/2017 0559   LDLCALC 112 (H) 11/30/2021 0837    Other Studies Reviewed Today: Echo from 2018 LVEF 50 to 55% Mild aortic valve stenosis  Assessment and Plan: Patient is a 67 year old M known to have moyamoya disease, history of CVA presented to cardiology clinic for follow-up visit.  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 They are hesitant to be initiated on any pharmaceuticals/GDMT for new onset cardiomyopathy. They also did not want any PCI or CABG and hence ischemic valuation will be deferred until they are agreeable. Patient's wife also said that she would prefer palliative/hospice rather than being on pharmaceuticals/medications and patient agreed with her. She also added they are firm believers that the human body will heal on its own if we let it be. At this time, I will not initiate any GDMT or recommend any ischemia evaluation due to patient preferences despite having a long conversation with them. I will obtain a limited echocardiogram in 6 months for LVEF assessment and will revisit GDMT initiation/ischemia evaluation at the time if the LVEF continues to be  low.  # Moderate aortic valve stenosis # Mild aortic valve regurgitation -Repeat echocardiogram every 1 year, next echo will be in 2025.  # Moyamoya disease # History of CVA -Continue aspirin 325 mg once daily -Management per PCP and urology  I have spent a total of 41 minutes with patient reviewing chart, EKGs, labs and examining patient as well as establishing an assessment and plan that was discussed with the patient.  > 50% of time was spent in direct patient care.     Medication Adjustments/Labs and Tests Ordered: Current medicines are reviewed at length with the patient today.  Concerns regarding medicines are outlined above.   Tests Ordered: Orders Placed This Encounter  Procedures   ECHOCARDIOGRAM LIMITED    Medication Changes: No orders of the defined types were placed in this encounter.   Disposition:  Follow up  6 months  Signed, Vangie Bicker, MD, 08/08/2022 7:14 PM    [facility] at [facility] [address] [place], [place] [address]

## 2022-08-09 ENCOUNTER — Encounter: Payer: Self-pay | Admitting: Internal Medicine

## 2022-08-09 ENCOUNTER — Ambulatory Visit (INDEPENDENT_AMBULATORY_CARE_PROVIDER_SITE_OTHER): Payer: Medicare Other | Admitting: Internal Medicine

## 2022-08-09 VITALS — BP 120/76 | HR 83 | Ht 67.0 in | Wt 126.0 lb

## 2022-08-09 DIAGNOSIS — I509 Heart failure, unspecified: Secondary | ICD-10-CM

## 2022-08-09 DIAGNOSIS — I350 Nonrheumatic aortic (valve) stenosis: Secondary | ICD-10-CM | POA: Diagnosis not present

## 2022-08-09 DIAGNOSIS — I675 Moyamoya disease: Secondary | ICD-10-CM

## 2022-08-09 DIAGNOSIS — J4520 Mild intermittent asthma, uncomplicated: Secondary | ICD-10-CM

## 2022-08-09 DIAGNOSIS — I351 Nonrheumatic aortic (valve) insufficiency: Secondary | ICD-10-CM | POA: Insufficient documentation

## 2022-08-09 NOTE — Assessment & Plan Note (Signed)
Does not have wheezing currently on physical exam, but his history is suggestive of asthma/reactive airway disease Albuterol inhaler as needed for now If required use of rescue inhaler needed, will add maintenance inhaler

## 2022-08-09 NOTE — Assessment & Plan Note (Signed)
Patient reported Has had multiple CVAs in the past, last acute CVA in 2018 On Aspirin 81 mg QD Denies Neurology eval

## 2022-08-09 NOTE — Patient Instructions (Signed)
Please continue taking Lasix as prescribed.  Please continue to follow low salt diet and ambulate as tolerated.

## 2022-08-09 NOTE — Assessment & Plan Note (Signed)
Has moderate AS, but wants to avoid any other invasive evaluation On Lasix for volume overload

## 2022-08-09 NOTE — Assessment & Plan Note (Addendum)
Had orthopnea and PND, concern for acute CHF -improved now with Lasix 20 mg twice daily Denies any other medicine for now, including beta-blocker, ARB or ARNI, SGLT2 inhibitor Declines ischemia workup EKG: Sinus rhythm. LVH. 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 Recent cardiology visit note and echo reviewed

## 2022-08-09 NOTE — Progress Notes (Signed)
Acute Office Visit  Subjective:    Patient ID: James Marsh, male    DOB: 09/21/1955, 67 y.o.   MRN: [id number]  Chief Complaint  Patient presents with   Shortness of Breath    Following up on dyspnea. Patient states he is breathing better and sleeping better    HPI Patient is in today for follow-up of intermittent dyspnea, which was especially worse at nighttime for the last 2 months. He was last seen on 12/27 for the same concern.  He had CXR, which showed mild right-sided pleural effusion and signs of mild vascular congestion.  He denied any leg swelling.  His BNP was found to be elevated and was placed on Lasix, which has improved his symptoms.  He has seen cardiologist and had Echo.  His Echo showed LVEF of 20 to 25% and moderate AS.  Past.  He was advised for GDMT, but he prefers to avoid any chemicals despite counseling by cardiologist and by me.  He agrees to continue Lasix twice daily for symptom relief for now.  He has also cut down salt intake.  Past Medical History:  Diagnosis Date   Acute CVA (cerebrovascular accident) ([place]) 01/18/2017   Allergy    pollen, foods   Arthritis    Moya moya disease    Neuromuscular disorder ([place])    Stroke [facility])     Past Surgical History:  Procedure Laterality Date   CERVICAL DISC SURGERY     VASECTOMY      Family History  Problem Relation Age of Onset   Cancer Mother 63       ovarian   Heart disease Father    Alcohol abuse Father    Stroke Father    Cancer Sister        brain   Cancer Brother        brain   Cancer Brother        brain   Heart disease Brother     Social History   Socioeconomic History   Marital status: Married    Spouse name: Not on file   Number of children: Not on file   Years of education: Not on file   Highest education level: Not on file  Occupational History   Not on file  Tobacco Use   Smoking status: Every Day    Types: Pipe    Start date: 07/25/1968   Smokeless tobacco: Never    Tobacco comments:    pipe smoker daily 3-4 times a day  Vaping Use   Vaping Use: Never used  Substance and Sexual Activity   Alcohol use: No   Drug use: No   Sexual activity: Not on file  Other Topics Concern   Not on file  Social History Narrative   Not on file   Social Determinants of Health   Financial Resource Strain: Not on file  Food Insecurity: Not on file  Transportation Needs: Not on file  Physical Activity: Not on file  Stress: Not on file  Social Connections: Not on file  Intimate Partner Violence: Not on file    Outpatient Medications Prior to Visit  Medication Sig Dispense Refill   albuterol (VENTOLIN HFA) 108 (90 Base) MCG/ACT inhaler Inhale 2 puffs into the lungs every 6 (six) hours as needed for wheezing or shortness of breath. 18 g 2   aspirin 325 MG EC tablet Take 325 mg by mouth daily.      b complex vitamins tablet Take 1  tablet by mouth daily.     Cholecalciferol (VITAMIN D) 50 MCG (2000 UT) tablet Take 2,000 Units by mouth daily.     furosemide (LASIX) 20 MG tablet Take 1 tablet (20 mg total) by mouth 2 (two) times daily. 180 tablet 3   potassium chloride (KLOR-CON M) 10 MEQ tablet Take 1 tablet (10 mEq total) by mouth daily. 90 tablet 3   vitamin C (ASCORBIC ACID) 500 MG tablet Take 500 mg by mouth daily.     vitamin E 400 UNIT capsule Take 400 Units by mouth daily.     No facility-administered medications prior to visit.    Allergies  Allergen Reactions   Penicillins Shortness Of Breath    Has patient had a PCN reaction causing immediate rash, facial/tongue/throat swelling, SOB or lightheadedness with hypotension: yes Has patient had a PCN reaction causing severe rash involving mucus membranes or skin necrosis: no Has patient had a PCN reaction that required hospitalization: yes Has patient had a PCN reaction occurring within the last 10 years: no If all of the above answers are "NO", then may proceed with Cephalosporin use.    No Healthtouch  Food Allergies     Patient allergic to nuts and dairy.    Review of Systems  Constitutional:  Negative for chills and fever.  HENT:  Negative for congestion and sore throat.   Eyes:  Negative for pain and discharge.  Respiratory:  Positive for shortness of breath and wheezing. Negative for cough.   Cardiovascular:  Negative for chest pain and palpitations.  Gastrointestinal:  Negative for constipation, diarrhea, nausea and vomiting.  Endocrine: Negative for polydipsia and polyuria.  Genitourinary:  Negative for dysuria and hematuria.  Musculoskeletal:  Negative for neck pain and neck stiffness.  Skin:  Negative for rash.       Skin lesion over right knee  Neurological:  Positive for weakness. Negative for numbness.  Psychiatric/Behavioral:  Negative for agitation and behavioral problems.        Objective:    Physical Exam Vitals reviewed.  Constitutional:      General: He is not in acute distress.    Appearance: He is not diaphoretic.  HENT:     Head: Normocephalic and atraumatic.     Nose: Nose normal.     Mouth/Throat:     Mouth: Mucous membranes are moist.  Eyes:     General: No scleral icterus.    Extraocular Movements: Extraocular movements intact.  Cardiovascular:     Rate and Rhythm: Normal rate and regular rhythm.     Pulses: Normal pulses.     Heart sounds: Normal heart sounds. No murmur heard. Pulmonary:     Breath sounds: Normal breath sounds. No wheezing or rales.  Musculoskeletal:     Cervical back: Neck supple. No tenderness.     Right lower leg: No edema.     Left lower leg: No edema.  Skin:    General: Skin is warm.     Findings: No rash.     Comments: Hyperkeratotic plaque over right knee, brownish in color  Neurological:     General: No focal deficit present.     Mental Status: He is alert and oriented to person, place, and time.  Psychiatric:        Mood and Affect: Mood normal.        Behavior: Behavior normal.     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 LVH. 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 Recent cardiology visit note and echo reviewed      Aortic valve stenosis, moderate (Chronic)    Has moderate AS, but wants to avoid any other invasive evaluation On Lasix for volume overload        Respiratory   Mild intermittent asthma without complication    Does not have wheezing currently on physical exam, but his history is suggestive of asthma/reactive airway disease Albuterol inhaler as needed for now If required use of rescue inhaler needed, will add maintenance inhaler        No orders of the defined types were placed in this encounter.    Lindell Spar, MD

## 2022-08-17 ENCOUNTER — Ambulatory Visit (INDEPENDENT_AMBULATORY_CARE_PROVIDER_SITE_OTHER): Payer: Medicare Other

## 2022-08-17 VITALS — BP 119/70 | HR 82 | Ht 65.0 in | Wt 125.0 lb

## 2022-08-17 DIAGNOSIS — Z0000 Encounter for general adult medical examination without abnormal findings: Secondary | ICD-10-CM | POA: Diagnosis not present

## 2022-08-17 NOTE — Progress Notes (Signed)
Subjective:   James Marsh is a 67 y.o. male who presents for Medicare Annual/Subsequent preventive examination.  Review of Systems     Objective:    Today's Vitals   08/17/22 0859 08/17/22 0904  BP: 119/70   Pulse: 82   SpO2: 98%   Weight: 125 lb (56.7 kg)   Height: '5\' 5"'$  (1.651 m)   PainSc:  7    Body mass index is 20.8 kg/m.     01/18/2017   11:42 PM 01/18/2017   11:35 PM 01/18/2017    2:52 PM  Advanced Directives  Does Patient Have a Medical Advance Directive? Yes Yes No  Type of Paramedic of [place];Living will [place]   Does patient want to make changes to medical advance directive? No - Patient declined No - Patient declined   Copy of [place] in Chart? No - copy requested    Would patient like information on creating a medical advance directive?  No - Patient declined     Current Medications (verified) Outpatient Encounter Medications as of 08/17/2022  Medication Sig   albuterol (VENTOLIN HFA) 108 (90 Base) MCG/ACT inhaler Inhale 2 puffs into the lungs every 6 (six) hours as needed for wheezing or shortness of breath.   aspirin 325 MG EC tablet Take 325 mg by mouth daily.    b complex vitamins tablet Take 1 tablet by mouth daily.   Cholecalciferol (VITAMIN D) 50 MCG (2000 UT) tablet Take 2,000 Units by mouth daily.   furosemide (LASIX) 20 MG tablet Take 1 tablet (20 mg total) by mouth 2 (two) times daily.   potassium chloride (KLOR-CON M) 10 MEQ tablet Take 1 tablet (10 mEq total) by mouth daily.   vitamin C (ASCORBIC ACID) 500 MG tablet Take 500 mg by mouth daily.   vitamin E 400 UNIT capsule Take 400 Units by mouth daily.   No facility-administered encounter medications on file as of 08/17/2022.    Allergies (verified) Penicillins and No healthtouch food allergies   History: Past Medical History:  Diagnosis Date   Acute CVA (cerebrovascular accident) ([place]) 01/18/2017   Allergy     pollen, foods   Arthritis    Moya moya disease    Neuromuscular disorder ([place])    Stroke [name]    Past Surgical History:  Procedure Laterality Date   CERVICAL DISC SURGERY     VASECTOMY     Family History  Problem Relation Age of Onset   Cancer Mother 35       ovarian   Heart disease Father    Alcohol abuse Father    Stroke Father    Cancer Sister        brain   Cancer Brother        brain   Cancer Brother        brain   Heart disease Brother    Social History   Socioeconomic History   Marital status: Married    Spouse name: Not on file   Number of children: Not on file   Years of education: Not on file   Highest education level: Not on file  Occupational History   Not on file  Tobacco Use   Smoking status: Every Day    Types: Pipe    Start date: 07/25/1968   Smokeless tobacco: Never   Tobacco comments:    pipe smoker daily 3-4 times a day  Vaping Use   Vaping Use: Never  used  Substance and Sexual Activity   Alcohol use: No   Drug use: No   Sexual activity: Not on file  Other Topics Concern   Not on file  Social History Narrative   Not on file   Social Determinants of Health   Financial Resource Strain: Low Risk  (08/17/2022)   Overall Financial Resource Strain (CARDIA)    Difficulty of Paying Living Expenses: Not hard at all  Food Insecurity: No Food Insecurity (08/17/2022)   Hunger Vital Sign    Worried About Running Out of Food in the Last Year: Never true    Ran Out of Food in the Last Year: Never true  Transportation Needs: No Transportation Needs (08/17/2022)   PRAPARE - Hydrologist (Medical): No    Lack of Transportation (Non-Medical): No  Physical Activity: Sufficiently Active (08/17/2022)   Exercise Vital Sign    Days of Exercise per Week: 7 days    Minutes of Exercise per Session: 60 min  Stress: No Stress Concern Present (08/17/2022)   [place]     Feeling of Stress : Not at all  Social Connections: Moderately Isolated (08/17/2022)   Social Connection and Isolation Panel [NHANES]    Frequency of Communication with Friends and Family: Once a week    Frequency of Social Gatherings with Friends and Family: More than three times a week    Attends Religious Services: Never    Marine scientist or Organizations: No    Attends Music therapist: Never    Marital Status: Married    Tobacco Counseling Ready to quit: No Counseling given: Not Answered Tobacco comments: pipe smoker daily 3-4 times a day   Clinical Intake:     Pain : 0-10 Pain Score: 7  Pain Location: Head Pain Onset: Other (comment) Pain Frequency: Constant     BMI - recorded: 20.8 Nutritional Status: BMI of 19-24  Normal Nutritional Risks: None  How often do you need to have someone help you when you read instructions, pamphlets, or other written materials from your doctor or pharmacy?: 1 - Never  Diabetic? No   Interpreter Needed?: No      Activities of Daily Living     No data to display          Patient Care Team: Lindell Spar, MD as PCP - General (Internal Medicine)  Indicate any recent Medical Services you may have received from other than [facility] providers in the past year (date may be approximate).     Assessment:   This is a routine wellness examination for James Marsh.  Hearing/Vision screen No results found.  Dietary issues and exercise activities discussed:     Goals Addressed   None    Depression Screen    08/17/2022    9:10 AM 08/09/2022    8:51 AM 07/20/2022    8:17 AM 06/28/2022    1:31 PM 11/25/2021    9:59 AM 01/09/2017   10:12 AM  PHQ 2/9 Scores  PHQ - 2 Score 0 0 0 0 0 0    Fall Risk    08/09/2022    8:51 AM 07/20/2022    8:17 AM 06/28/2022    1:31 PM 11/25/2021    9:59 AM 01/09/2017   10:12 AM  Fall Risk   Falls in the past year? 0 0 0 0 No  Number falls in past yr: 0 0 0 0  Injury with  Fall? 0 0 0 0   Risk for fall due to :    No Fall Risks   Follow up    Falls evaluation completed     FALL RISK PREVENTION PERTAINING TO THE HOME:  Any stairs in or around the home? Yes If so, are there any without handrails? Yes  Home free of loose throw rugs in walkways, pet beds, electrical cords, etc? Yes  Adequate lighting in your home to reduce risk of falls? Yes   ASSISTIVE DEVICES UTILIZED TO PREVENT FALLS:  Life alert? No  Use of a cane, walker or w/c? No  Grab bars in the bathroom? Yes  Shower chair or bench in shower? No  Elevated toilet seat or a handicapped toilet? No   TIMED UP AND GO:  Was the test performed? Yes .  Length of time to ambulate 10 feet: 5 sec.   Gait steady and fast without use of assistive device  Cognitive Function:        Immunizations  There is no immunization history on file for this patient.  TDAP status: Due, Education has been provided regarding the importance of this vaccine. Advised may receive this vaccine at local pharmacy or Health Dept. Aware to provide a copy of the vaccination record if obtained from local pharmacy or Health Dept. Verbalized acceptance and understanding.  Flu Vaccine status: Up to date  Pneumococcal vaccine status: Up to date  Covid-19 vaccine status: Declined, Education has been provided regarding the importance of this vaccine but patient still declined. Advised may receive this vaccine at local pharmacy or Health Dept.or vaccine clinic. Aware to provide a copy of the vaccination record if obtained from local pharmacy or Health Dept. Verbalized acceptance and understanding.  Qualifies for Shingles Vaccine? Yes   Zostavax completed No   Shingrix Completed?: No.    Education has been provided regarding the importance of this vaccine. Patient has been advised to call insurance company to determine out of pocket expense if they have not yet received this vaccine. Advised may also receive vaccine at local  pharmacy or Health Dept. Verbalized acceptance and understanding.  Screening Tests Health Maintenance  Topic Date Due   COVID-19 Vaccine (1) Never done   Hepatitis C Screening  Never done   DTaP/Tdap/Td (1 - Tdap) Never done   COLONOSCOPY (Pts 45-40yr Insurance coverage will need to be confirmed)  Never done   Zoster Vaccines- Shingrix (1 of 2) 09/27/2022 (Originally 04/15/1975)   INFLUENZA VACCINE  10/23/2022 (Originally 02/22/2022)   Pneumonia Vaccine 69 Years old (1 - PCV) 06/29/2023 (Originally 04/15/1962)   Medicare Annual Wellness (AWV)  08/18/2023   HPV VACCINES  Aged Out    Health Maintenance  Health Maintenance Due  Topic Date Due   COVID-19 Vaccine (1) Never done   Hepatitis C Screening  Never done   DTaP/Tdap/Td (1 - Tdap) Never done   COLONOSCOPY (Pts 45-438yrInsurance coverage will need to be confirmed)  Never done    Colon Cancer screening: due  Lung Cancer Screening: (Low Dose CT Chest recommended if Age 69-80ears, 30 pack-year currently smoking OR have quit w/in 15years.) does qualify.   Lung Cancer Screening Referral: declined  Additional Screening:  Hepatitis C Screening: does qualify.  Vision Screening: Recommended annual ophthalmology exams for early detection of glaucoma and other disorders of the eye. Is the patient up to date with their annual eye exam?  No  Who is the provider or what is the  name of the office in which the patient attends annual eye exams? NA If pt is not established with a provider, would they like to be referred to a provider to establish care? No .   Dental Screening: Recommended annual dental exams for proper oral hygiene  Community Resource Referral / Chronic Care Management: CRR required this visit?  No   CCM required this visit?  No      Plan:     I have personally reviewed and noted the following in the patient's chart:   Medical and social history Use of alcohol, tobacco or illicit drugs  Current medications  and supplements including opioid prescriptions. Patient is not currently taking opioid prescriptions. Functional ability and status Nutritional status Physical activity Advanced directives List of other physicians Hospitalizations, surgeries, and ER visits in previous 12 months Vitals Screenings to include cognitive, depression, and falls Referrals and appointments  In addition, I have reviewed and discussed with patient certain preventive protocols, quality metrics, and best practice recommendations. A written personalized care plan for preventive services as well as general preventive health recommendations were provided to patient.     Smitty Knudsen, CMA   08/17/2022

## 2022-08-26 ENCOUNTER — Ambulatory Visit: Payer: Medicare Other | Admitting: Internal Medicine

## 2022-08-31 ENCOUNTER — Ambulatory Visit (HOSPITAL_COMMUNITY)
Admission: RE | Admit: 2022-08-31 | Discharge: 2022-08-31 | Disposition: A | Discharge status: Home / Self Care | Payer: Medicare Other | Source: Ambulatory Visit | Attending: Internal Medicine | Admitting: Internal Medicine

## 2022-08-31 DIAGNOSIS — F17211 Nicotine dependence, cigarettes, in remission: Secondary | ICD-10-CM | POA: Diagnosis not present

## 2022-08-31 DIAGNOSIS — Z87891 Personal history of nicotine dependence: Secondary | ICD-10-CM | POA: Diagnosis not present

## 2022-09-12 ENCOUNTER — Encounter: Payer: Self-pay | Admitting: Internal Medicine

## 2022-11-01 DIAGNOSIS — H04123 Dry eye syndrome of bilateral lacrimal glands: Secondary | ICD-10-CM | POA: Diagnosis not present

## 2022-11-29 ENCOUNTER — Encounter: Payer: Self-pay | Admitting: Internal Medicine

## 2022-11-29 ENCOUNTER — Ambulatory Visit (INDEPENDENT_AMBULATORY_CARE_PROVIDER_SITE_OTHER): Payer: Medicare Other | Admitting: Internal Medicine

## 2022-11-29 VITALS — BP 121/75 | HR 81 | Ht 67.0 in | Wt 122.6 lb

## 2022-11-29 DIAGNOSIS — E782 Mixed hyperlipidemia: Secondary | ICD-10-CM

## 2022-11-29 DIAGNOSIS — E559 Vitamin D deficiency, unspecified: Secondary | ICD-10-CM | POA: Diagnosis not present

## 2022-11-29 DIAGNOSIS — S61412A Laceration without foreign body of left hand, initial encounter: Secondary | ICD-10-CM

## 2022-11-29 DIAGNOSIS — Z1159 Encounter for screening for other viral diseases: Secondary | ICD-10-CM | POA: Diagnosis not present

## 2022-11-29 DIAGNOSIS — I5022 Chronic systolic (congestive) heart failure: Secondary | ICD-10-CM | POA: Diagnosis not present

## 2022-11-29 DIAGNOSIS — Z23 Encounter for immunization: Secondary | ICD-10-CM

## 2022-11-29 DIAGNOSIS — I675 Moyamoya disease: Secondary | ICD-10-CM

## 2022-11-29 DIAGNOSIS — I350 Nonrheumatic aortic (valve) stenosis: Secondary | ICD-10-CM

## 2022-11-29 DIAGNOSIS — Z8673 Personal history of transient ischemic attack (TIA), and cerebral infarction without residual deficits: Secondary | ICD-10-CM | POA: Diagnosis not present

## 2022-11-29 NOTE — Assessment & Plan Note (Signed)
Has moderate AS, but wants to avoid any other invasive evaluation On Lasix for volume overload 

## 2022-11-29 NOTE — Assessment & Plan Note (Signed)
Left hand laceration, healing well Tdap vaccine today

## 2022-11-29 NOTE — Progress Notes (Signed)
Established Patient Office Visit  Subjective:  Patient ID: James Marsh, male    DOB: 08-Dec-1955  Age: 67 y.o. MRN: [id number]  CC:  Chief Complaint  Patient presents with   Annual Exam    HPI James Marsh is a 67 y.o. male with past medical history of CHF, moyamoya syndrome, multiple CVA, cervical myelopathy and chronic headache who presents for f/u of his chronic medical conditions.  He was placed on Lasix 20 mg twice daily for acute CHF, and has noticed significant improvement in his dyspnea.  He is also trying to follow low-salt diet and is trying to cut down coffee intake.  Denies any leg swelling.  Denies any orthopnea or PND currently.  He reports history of moyamoya syndrome, and has had multiple CVA in the past.  Last acute CVA was in 2018.  He has been on aspirin 81 mg daily, but has denied Plavix, statin and AC in the past.  He has intermittent numbness and tingling of the face.  He currently denies any numbness or tingling.  He does not use any walking support.  He has seen neurologist at [facility] and Dr. Gerilyn Pilgrim in the past.  He prefers to avoid taking any medications.  He denies neurology eval for now.  He had a laceration injury from a metal piece while working in his yard on 11/22/22.  Had bleeding for about a minute, but currently does not have any discharge or bleeding.  Wound has been healing well.  Past Medical History:  Diagnosis Date   Acute CVA (cerebrovascular accident) (HCC) 01/18/2017   Allergy    pollen, foods   Arthritis    Moya moya disease    Neuromuscular disorder (HCC)    Stroke [facility])     Past Surgical History:  Procedure Laterality Date   CERVICAL DISC SURGERY     VASECTOMY      Family History  Problem Relation Age of Onset   Cancer Mother 16       ovarian   Heart disease Father    Alcohol abuse Father    Stroke Father    Cancer Sister        brain   Cancer Brother        brain   Cancer Brother        brain   Heart  disease Brother     Social History   Socioeconomic History   Marital status: Married    Spouse name: Not on file   Number of children: Not on file   Years of education: Not on file   Highest education level: Not on file  Occupational History   Not on file  Tobacco Use   Smoking status: Every Day    Types: Pipe    Start date: 07/25/1968   Smokeless tobacco: Never   Tobacco comments:    pipe smoker daily 3-4 times a day  Vaping Use   Vaping Use: Never used  Substance and Sexual Activity   Alcohol use: No   Drug use: No   Sexual activity: Not on file  Other Topics Concern   Not on file  Social History Narrative   Not on file   Social Determinants of Health   Financial Resource Strain: Low Risk  (08/17/2022)   Overall Financial Resource Strain (CARDIA)    Difficulty of Paying Living Expenses: Not hard at all  Food Insecurity: No Food Insecurity (08/17/2022)   Hunger Vital Sign    Worried  About Running Out of Food in the Last Year: Never true    Ran Out of Food in the Last Year: Never true  Transportation Needs: No Transportation Needs (08/17/2022)   PRAPARE - Administrator, Civil Service (Medical): No    Lack of Transportation (Non-Medical): No  Physical Activity: Sufficiently Active (08/17/2022)   Exercise Vital Sign    Days of Exercise per Week: 7 days    Minutes of Exercise per Session: 60 min  Stress: No Stress Concern Present (08/17/2022)   [employer] of Occupational Health - Occupational Stress Questionnaire    Feeling of Stress : Not at all  Social Connections: Moderately Isolated (08/17/2022)   Social Connection and Isolation Panel [NHANES]    Frequency of Communication with Friends and Family: Once a week    Frequency of Social Gatherings with Friends and Family: More than three times a week    Attends Religious Services: Never    Database administrator or Organizations: No    Attends Banker Meetings: Never    Marital Status:  Married  Catering manager Violence: Not At Risk (08/17/2022)   Humiliation, Afraid, Rape, and Kick questionnaire    Fear of Current or Ex-Partner: No    Emotionally Abused: No    Physically Abused: No    Sexually Abused: No    Outpatient Medications Prior to Visit  Medication Sig Dispense Refill   albuterol (VENTOLIN HFA) 108 (90 Base) MCG/ACT inhaler Inhale 2 puffs into the lungs every 6 (six) hours as needed for wheezing or shortness of breath. 18 g 2   aspirin 325 MG EC tablet Take 325 mg by mouth daily.      b complex vitamins tablet Take 1 tablet by mouth daily.     Cholecalciferol (VITAMIN D) 50 MCG (2000 UT) tablet Take 2,000 Units by mouth daily.     furosemide (LASIX) 20 MG tablet Take 1 tablet (20 mg total) by mouth 2 (two) times daily. 180 tablet 3   potassium chloride (KLOR-CON M) 10 MEQ tablet Take 1 tablet (10 mEq total) by mouth daily. 90 tablet 3   vitamin C (ASCORBIC ACID) 500 MG tablet Take 500 mg by mouth daily.     vitamin E 400 UNIT capsule Take 400 Units by mouth daily.     No facility-administered medications prior to visit.    Allergies  Allergen Reactions   Penicillins Shortness Of Breath    Has patient had a PCN reaction causing immediate rash, facial/tongue/throat swelling, SOB or lightheadedness with hypotension: yes Has patient had a PCN reaction causing severe rash involving mucus membranes or skin necrosis: no Has patient had a PCN reaction that required hospitalization: yes Has patient had a PCN reaction occurring within the last 10 years: no If all of the above answers are "NO", then may proceed with Cephalosporin use.    No Healthtouch Food Allergies     Patient allergic to nuts and dairy.    ROS Review of Systems  Constitutional:  Negative for chills and fever.  HENT:  Negative for congestion and sore throat.   Eyes:  Negative for pain and discharge.  Respiratory:  Negative for cough, shortness of breath and wheezing.   Cardiovascular:   Negative for chest pain and palpitations.  Gastrointestinal:  Negative for constipation, diarrhea, nausea and vomiting.  Endocrine: Negative for polydipsia and polyuria.  Genitourinary:  Negative for dysuria and hematuria.  Musculoskeletal:  Negative for neck pain and neck stiffness.  Skin:  Negative for rash.       Skin lesion over right knee Laceration injury over left hand  Neurological:  Positive for weakness. Negative for numbness.  Psychiatric/Behavioral:  Negative for agitation and behavioral problems.       Objective:    Physical Exam Vitals reviewed.  Constitutional:      General: He is not in acute distress.    Appearance: He is not diaphoretic.  HENT:     Head: Normocephalic and atraumatic.     Nose: Nose normal.     Mouth/Throat:     Mouth: Mucous membranes are moist.  Eyes:     General: No scleral icterus.    Extraocular Movements: Extraocular movements intact.  Cardiovascular:     Rate and Rhythm: Normal rate and regular rhythm.     Pulses: Normal pulses.     Heart sounds: Normal heart sounds. No murmur heard. Pulmonary:     Breath sounds: Normal breath sounds. No wheezing or rales.  Abdominal:     Palpations: Abdomen is soft.     Tenderness: There is no abdominal tenderness.  Musculoskeletal:     Cervical back: Neck supple. No tenderness.     Right lower leg: No edema.     Left lower leg: No edema.  Skin:    General: Skin is warm.     Findings: No rash.     Comments: Hyperkeratotic plaque over right knee, brownish in color Laceration over dorsal aspect of left hand, about 4 cm in length  Neurological:     General: No focal deficit present.     Mental Status: He is alert and oriented to person, place, and time.  Psychiatric:        Mood and Affect: Mood normal.        Behavior: Behavior normal.     BP 121/75   Pulse 81   Ht 5\' 7"  (1.702 m)   Wt 122 lb 9.6 oz (55.6 kg)   SpO2 98%   BMI 19.20 kg/m  Wt Readings from Last 3 Encounters:   11/29/22 122 lb 9.6 oz (55.6 kg)  08/17/22 125 lb (56.7 kg)  08/09/22 126 lb (57.2 kg)    No results found for: "TSH" Lab Results  Component Value Date   WBC 4.6 11/30/2021   HGB 13.8 11/30/2021   HCT 40.4 11/30/2021   MCV 90 11/30/2021   PLT 205 11/30/2021   Lab Results  Component Value Date   NA 136 07/26/2022   K 4.5 07/26/2022   CO2 29 07/26/2022   GLUCOSE 90 07/26/2022   BUN 22 07/26/2022   CREATININE 1.21 07/26/2022   BILITOT 0.8 11/30/2021   ALKPHOS 80 11/30/2021   AST 16 11/30/2021   ALT 11 11/30/2021   PROT 6.7 11/30/2021   ALBUMIN 4.4 11/30/2021   CALCIUM 9.3 07/26/2022   ANIONGAP 10 07/26/2022   EGFR 71 07/20/2022   Lab Results  Component Value Date   CHOL 180 11/30/2021   Lab Results  Component Value Date   HDL 52 11/30/2021   Lab Results  Component Value Date   LDLCALC 112 (H) 11/30/2021   Lab Results  Component Value Date   TRIG 88 11/30/2021   Lab Results  Component Value Date   CHOLHDL 3.5 11/30/2021   Lab Results  Component Value Date   HGBA1C 5.1 11/30/2021      Assessment & Plan:   Problem List Items Addressed This Visit       Cardiovascular  and Mediastinum   Moyamoya syndrome (Chronic)    Patient reported Has had multiple CVAs in the past, last acute CVA in 2018 On Aspirin 81 mg QD Denies Neurology eval      Relevant Orders   TSH + free T4   Aortic valve stenosis, moderate (Chronic)    Has moderate AS, but wants to avoid any other invasive evaluation On Lasix for volume overload      Relevant Orders   TSH + free T4   Chronic congestive heart failure (HCC) - Primary    Had orthopnea and PND, concern for acute CHF -improved now with Lasix 20 mg twice daily Denies any other medicine for now, including beta-blocker, ARB or ARNI, SGLT2 inhibitor Declines ischemia workup EKG: Sinus rhythm. LVH. 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 Recent cardiology visit note and echo reviewed Check CMP and BNP       Relevant Orders   CBC   CMP14+EGFR   B Nat Peptide   TSH + free T4     Other   History of CVA (cerebrovascular accident) (Chronic)    On Aspirin, needs to take 81 mg QD Denies statin Check CBC, CMP and lipid profile      Relevant Orders   TSH + free T4   Laceration of left hand    Left hand laceration, healing well Tdap vaccine today      Relevant Orders   Tdap vaccine greater than or equal to 7yo IM (Completed)   Other Visit Diagnoses     Mixed hyperlipidemia       Relevant Orders   Lipid Profile   Vitamin D deficiency       Relevant Orders   Vitamin D (25 hydroxy)   Need for hepatitis C screening test       Relevant Orders   Hepatitis C Antibody   Encounter for immunization       Relevant Orders   Tdap vaccine greater than or equal to 7yo IM (Completed)       No orders of the defined types were placed in this encounter.   Follow-up: Return in about 6 months (around 06/01/2023).    Anabel Halon, MD

## 2022-11-29 NOTE — Assessment & Plan Note (Signed)
Patient reported Has had multiple CVAs in the past, last acute CVA in 2018 On Aspirin 81 mg QD Denies Neurology eval 

## 2022-11-29 NOTE — Patient Instructions (Signed)
Please continue to take medications as prescribed.  Please continue to follow low salt diet and perform moderate exercise/walking at least 150 mins/week.  Please get fasting blood tests done in a week.

## 2022-11-29 NOTE — Assessment & Plan Note (Addendum)
Had orthopnea and PND, concern for acute CHF -improved now with Lasix 20 mg twice daily Denies any other medicine for now, including beta-blocker, ARB or ARNI, SGLT2 inhibitor Declines ischemia workup EKG: Sinus rhythm. LVH. 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 Recent cardiology visit note and echo reviewed Check CMP and BNP

## 2022-11-29 NOTE — Assessment & Plan Note (Addendum)
On Aspirin, needs to take 81 mg QD ?Denies statin ?Check CBC, CMP and lipid profile ?

## 2022-12-02 DIAGNOSIS — I350 Nonrheumatic aortic (valve) stenosis: Secondary | ICD-10-CM | POA: Diagnosis not present

## 2022-12-02 DIAGNOSIS — I675 Moyamoya disease: Secondary | ICD-10-CM | POA: Diagnosis not present

## 2022-12-02 DIAGNOSIS — Z1159 Encounter for screening for other viral diseases: Secondary | ICD-10-CM | POA: Diagnosis not present

## 2022-12-02 DIAGNOSIS — E559 Vitamin D deficiency, unspecified: Secondary | ICD-10-CM | POA: Diagnosis not present

## 2022-12-02 DIAGNOSIS — I5022 Chronic systolic (congestive) heart failure: Secondary | ICD-10-CM | POA: Diagnosis not present

## 2022-12-02 DIAGNOSIS — E782 Mixed hyperlipidemia: Secondary | ICD-10-CM | POA: Diagnosis not present

## 2022-12-02 DIAGNOSIS — Z8673 Personal history of transient ischemic attack (TIA), and cerebral infarction without residual deficits: Secondary | ICD-10-CM | POA: Diagnosis not present

## 2022-12-03 LAB — HEPATITIS C ANTIBODY: Hep C Virus Ab: NONREACTIVE

## 2022-12-03 LAB — CMP14+EGFR
AST: 18 IU/L (ref 0–40)
Albumin: 4.3 g/dL (ref 3.9–4.9)
Alkaline Phosphatase: 80 IU/L (ref 44–121)
BUN: 22 mg/dL (ref 8–27)
CO2: 24 mmol/L (ref 20–29)
Calcium: 9.6 mg/dL (ref 8.6–10.2)
Chloride: 99 mmol/L (ref 96–106)
Creatinine, Ser: 1.32 mg/dL — ABNORMAL HIGH (ref 0.76–1.27)
Glucose: 81 mg/dL (ref 70–99)
Potassium: 4.2 mmol/L (ref 3.5–5.2)
Sodium: 141 mmol/L (ref 134–144)
eGFR: 59 mL/min/{1.73_m2} — ABNORMAL LOW (ref >59)

## 2022-12-03 LAB — CBC
Hemoglobin: 14 g/dL (ref 13.0–17.7)
MCV: 91 fL (ref 79–97)
RDW: 14.2 % (ref 11.6–15.4)

## 2022-12-03 LAB — TSH+FREE T4
Free T4: 1.5 ng/dL (ref 0.82–1.77)
TSH: 1.7 u[IU]/mL (ref 0.450–4.500)

## 2022-12-03 LAB — LIPID PANEL
Cholesterol, Total: 199 mg/dL (ref 100–199)
HDL: 60 mg/dL (ref >39)
LDL Chol Calc (NIH): 124 mg/dL — ABNORMAL HIGH (ref 0–99)

## 2022-12-04 LAB — CMP14+EGFR
ALT: 10 IU/L (ref 0–44)
Albumin/Globulin Ratio: 1.8 (ref 1.2–2.2)
BUN/Creatinine Ratio: 17 (ref 10–24)
Bilirubin Total: 1 mg/dL (ref 0.0–1.2)
Globulin, Total: 2.4 g/dL (ref 1.5–4.5)
Total Protein: 6.7 g/dL (ref 6.0–8.5)

## 2022-12-04 LAB — CBC
Hematocrit: 41.3 % (ref 37.5–51.0)
MCH: 30.8 pg (ref 26.6–33.0)
MCHC: 33.9 g/dL (ref 31.5–35.7)
Platelets: 180 10*3/uL (ref 150–450)
RBC: 4.55 x10E6/uL (ref 4.14–5.80)
WBC: 4 10*3/uL (ref 3.4–10.8)

## 2022-12-04 LAB — VITAMIN D 25 HYDROXY (VIT D DEFICIENCY, FRACTURES): Vit D, 25-Hydroxy: 38.7 ng/mL (ref 30.0–100.0)

## 2022-12-04 LAB — BRAIN NATRIURETIC PEPTIDE: BNP: 479.3 pg/mL — ABNORMAL HIGH (ref 0.0–100.0)

## 2022-12-04 LAB — LIPID PANEL
Chol/HDL Ratio: 3.3 ratio (ref 0.0–5.0)
Triglycerides: 81 mg/dL (ref 0–149)
VLDL Cholesterol Cal: 15 mg/dL (ref 5–40)

## 2022-12-05 ENCOUNTER — Encounter: Payer: Self-pay | Admitting: Internal Medicine

## 2023-02-02 ENCOUNTER — Ambulatory Visit (HOSPITAL_COMMUNITY)
Admission: RE | Admit: 2023-02-02 | Discharge: 2023-02-02 | Disposition: A | Discharge status: Home / Self Care | Payer: Medicare Other | Source: Ambulatory Visit | Attending: Internal Medicine | Admitting: Internal Medicine

## 2023-02-02 DIAGNOSIS — I509 Heart failure, unspecified: Secondary | ICD-10-CM | POA: Diagnosis not present

## 2023-02-02 LAB — ECHOCARDIOGRAM LIMITED
Area-P 1/2: 5.62 cm2
Est EF: 20
S' Lateral: 5.3 cm

## 2023-02-02 NOTE — Progress Notes (Signed)
  Echocardiogram 2D Echocardiogram has been performed.  James Marsh 02/02/2023, 11:01 AM

## 2023-02-06 ENCOUNTER — Other Ambulatory Visit (HOSPITAL_COMMUNITY): Payer: Medicare Other

## 2023-02-08 ENCOUNTER — Ambulatory Visit: Payer: Medicare Other | Attending: Internal Medicine | Admitting: Internal Medicine

## 2023-02-08 ENCOUNTER — Ambulatory Visit: Payer: Medicare Other | Admitting: Internal Medicine

## 2023-02-08 ENCOUNTER — Encounter: Payer: Self-pay | Admitting: Internal Medicine

## 2023-02-08 VITALS — BP 136/64 | HR 78 | Ht 67.0 in | Wt 123.6 lb

## 2023-02-08 DIAGNOSIS — I429 Cardiomyopathy, unspecified: Secondary | ICD-10-CM | POA: Diagnosis not present

## 2023-02-08 NOTE — Patient Instructions (Signed)
Medication Instructions:  Your physician recommends that you continue on your current medications as directed. Please refer to the Current Medication list given to you today.  *If you need a refill on your cardiac medications before your next appointment, please call your pharmacy*   Lab Work: None If you have labs (blood work) drawn today and your tests are completely normal, you will receive your results only by: [place] (if you have MyChart) OR A paper copy in the mail If you have any lab test that is abnormal or we need to change your treatment, we will call you to review the results.   Testing/Procedures: None   Follow-Up: At [facility], you and your health needs are our priority.  As part of our continuing mission to provide you with exceptional heart care, we have created designated Provider Care Teams.  These Care Teams include your primary Cardiologist (physician) and Advanced Practice Providers (APPs -  Physician Assistants and Nurse Practitioners) who all work together to provide you with the care you need, when you need it.  We recommend signing up for the patient portal called "MyChart".  Sign up information is provided on this After Visit Summary.  MyChart is used to connect with patients for Virtual Visits (Telemedicine).  Patients are able to view lab/test results, encounter notes, upcoming appointments, etc.  Non-urgent messages can be sent to your provider as well.   To learn more about what you can do with MyChart, go to [email or web address].    Your next appointment:   6 month(s)  Provider:   Claudina Lick, MD    Other Instructions

## 2023-02-09 DIAGNOSIS — I429 Cardiomyopathy, unspecified: Secondary | ICD-10-CM | POA: Insufficient documentation

## 2023-02-09 NOTE — Progress Notes (Signed)
Cardiology Office Note  Date: 02/09/2023   ID: James Marsh, DOB 1956-01-17, MRN [id number]  PCP:  Anabel Halon, MD  Cardiologist:  Marjo Bicker, MD Electrophysiologist:  None    History of Present Illness: James Marsh is a 67 y.o. male known to have moyamoya disease, history of CVA, new onset cardiomyopathy is here for follow-up visit.  Patient was initially referred to cardiology clinic for evaluation of CHF. Echo showed LVEF 20 to 25%, moderate aortic valve stenosis and mild aortic regurgitation and the repeat echocardiogram from 7/24 showed similar LVEF findings.  Patient refused to be on GDMT and preferred to be on Lasix for symptomatic management.  He was previously seen by PCP and the Lasix dose was decreased from 20 mg twice daily to once daily.  He is doing great.  No symptoms of DOE, orthopnea, PND or leg swelling. No angina.  No dizziness, lightheadedness, syncope. Patient and his wife did not prefer any invasive procedures for ischemia evaluation at this time.  Past Medical History:  Diagnosis Date   Acute CVA (cerebrovascular accident) (HCC) 01/18/2017   Allergy    pollen, foods   Arthritis    Moya moya disease    Neuromuscular disorder (HCC)    Stroke [facility])     Past Surgical History:  Procedure Laterality Date   CERVICAL DISC SURGERY     VASECTOMY      Current Outpatient Medications  Medication Sig Dispense Refill   albuterol (VENTOLIN HFA) 108 (90 Base) MCG/ACT inhaler Inhale 2 puffs into the lungs every 6 (six) hours as needed for wheezing or shortness of breath. 18 g 2   aspirin 81 MG chewable tablet Chew 81 mg by mouth daily.     b complex vitamins tablet Take 1 tablet by mouth daily.     Cholecalciferol (VITAMIN D) 50 MCG (2000 UT) tablet Take 2,000 Units by mouth daily.     furosemide (LASIX) 20 MG tablet Take 1 tablet (20 mg total) by mouth 2 (two) times daily. (Patient taking differently: Take 20 mg by mouth daily.) 180 tablet 3    potassium chloride (KLOR-CON M) 10 MEQ tablet Take 1 tablet (10 mEq total) by mouth daily. 90 tablet 3   vitamin C (ASCORBIC ACID) 500 MG tablet Take 500 mg by mouth daily.     vitamin E 400 UNIT capsule Take 400 Units by mouth daily.     No current facility-administered medications for this visit.   Allergies:  Penicillins and No healthtouch food allergies   Social History: The patient  reports that he has been smoking pipe. He started smoking about 54 years ago. He has never used smokeless tobacco. He reports that he does not drink alcohol and does not use drugs.   Family History: The patient's family history includes Alcohol abuse in his father; Cancer in his brother, brother, and sister; Cancer (age of onset: 12) in his mother; Heart disease in his brother and father; Stroke in his father.   ROS:  Please see the history of present illness. Otherwise, complete review of systems is positive for none.  All other systems are reviewed and negative.   Physical Exam: VS:  BP 136/64 (BP Location: Left Arm, Patient Position: Sitting, Cuff Size: Normal)   Pulse 78   Ht 5\' 7"  (1.702 m)   Wt 123 lb 9.6 oz (56.1 kg)   SpO2 98%   BMI 19.36 kg/m , BMI Body mass index is 19.36 kg/m.  Wt  Readings from Last 3 Encounters:  02/08/23 123 lb 9.6 oz (56.1 kg)  11/29/22 122 lb 9.6 oz (55.6 kg)  08/17/22 125 lb (56.7 kg)    General: Patient appears comfortable at rest. HEENT: Conjunctiva and lids normal, oropharynx clear with moist mucosa. Neck: JVD elevated Lungs: Clear to auscultation, nonlabored breathing at rest. Cardiac: Regular rate and rhythm, no S3 or significant systolic murmur, no pericardial rub. Abdomen: Soft, nontender, no hepatomegaly, bowel sounds present, no guarding or rebound. Extremities: No pitting edema, distal pulses 2+. Skin: Warm and dry. Musculoskeletal: No kyphosis. Neuropsychiatric: Alert and oriented x3, affect grossly appropriate.  ECG:  An ECG dated 07/22/22 was  personally reviewed today and demonstrated:  NSR and first degree AVB  Recent Labwork: 12/02/2022: ALT 10; AST 18; BNP 479.3; BUN 22; Creatinine, Ser 1.32; Hemoglobin 14.0; Platelets 180; Potassium 4.2; Sodium 141; TSH 1.700     Component Value Date/Time   CHOL 199 12/02/2022 0841   TRIG 81 12/02/2022 0841   HDL 60 12/02/2022 0841   CHOLHDL 3.3 12/02/2022 0841   CHOLHDL 5.1 01/19/2017 0559   VLDL 13 01/19/2017 0559   LDLCALC 124 (H) 12/02/2022 0841    Other Studies Reviewed Today: Echo from 2018 LVEF 50 to 55% Mild aortic valve stenosis  Assessment and Plan: Patient is a 67 year old M known to have moyamoya disease, history of CVA presented to cardiology clinic for follow-up visit.  # New onset cardiomyopathy, LVEF 20 to 25% -Continue p.o. Lasix 20 mg once daily (dose decreased at the PCPs visit per patient's request) and potassium supplements. -I discussed the benefits of initiation of GDMT and ischemia evaluation, patient and his wife wants to think about it and will let me know. At this time, they are hesitant to be initiated on any pharmaceutical/GDMT for new onset cardiomyopathy. Refused any invasive procedures. Patient and his wife strongly preferred symptomatic management.  # Moderate aortic valve stenosis # Mild aortic valve regurgitation -Repeat echocardiogram every 1 year, next echo will be in 2025.  # Moyamoya disease # History of CVA -Continue aspirin 81 mg once daily (previously on 325 mg).  Follows with PCP and neurology.  I have spent a total of 30 minutes with patient reviewing chart, EKGs, labs and examining patient as well as establishing an assessment and plan that was discussed with the patient.  > 50% of time was spent in direct patient care.     Medication Adjustments/Labs and Tests Ordered: Current medicines are reviewed at length with the patient today.  Concerns regarding medicines are outlined above.   Tests Ordered: No orders of the defined types  were placed in this encounter.   Medication Changes: No orders of the defined types were placed in this encounter.   Disposition:  Follow up  6 months  Signed, Yelena Metzer Verne Spurr, MD, 02/09/2023 5:13 PM    [facility] at [facility] [address] [place], [place] [address]

## 2023-02-13 ENCOUNTER — Ambulatory Visit: Payer: Medicare Other | Admitting: Internal Medicine

## 2023-06-01 ENCOUNTER — Ambulatory Visit (INDEPENDENT_AMBULATORY_CARE_PROVIDER_SITE_OTHER): Payer: Medicare Other | Admitting: Internal Medicine

## 2023-06-01 ENCOUNTER — Encounter: Payer: Self-pay | Admitting: Internal Medicine

## 2023-06-01 VITALS — BP 127/75 | HR 80 | Ht 67.0 in | Wt 125.0 lb

## 2023-06-01 DIAGNOSIS — Z66 Do not resuscitate: Secondary | ICD-10-CM | POA: Insufficient documentation

## 2023-06-01 DIAGNOSIS — Z2821 Immunization not carried out because of patient refusal: Secondary | ICD-10-CM

## 2023-06-01 DIAGNOSIS — R5382 Chronic fatigue, unspecified: Secondary | ICD-10-CM

## 2023-06-01 DIAGNOSIS — I429 Cardiomyopathy, unspecified: Secondary | ICD-10-CM | POA: Diagnosis not present

## 2023-06-01 DIAGNOSIS — I5022 Chronic systolic (congestive) heart failure: Secondary | ICD-10-CM

## 2023-06-01 DIAGNOSIS — E876 Hypokalemia: Secondary | ICD-10-CM | POA: Diagnosis not present

## 2023-06-01 DIAGNOSIS — I350 Nonrheumatic aortic (valve) stenosis: Secondary | ICD-10-CM

## 2023-06-01 DIAGNOSIS — Z8673 Personal history of transient ischemic attack (TIA), and cerebral infarction without residual deficits: Secondary | ICD-10-CM

## 2023-06-01 MED ORDER — FUROSEMIDE 20 MG PO TABS: 20.0000 mg | ORAL_TABLET | Freq: Two times a day (BID) | ORAL | 3 refills | Status: DC

## 2023-06-01 MED ORDER — POTASSIUM CHLORIDE CRYS ER 10 MEQ PO TBCR: 10.0000 meq | EXTENDED_RELEASE_TABLET | Freq: Every day | ORAL | 3 refills | Status: DC

## 2023-06-01 NOTE — Progress Notes (Signed)
Established Patient Office Visit  Subjective:  Patient ID: James Marsh, male    DOB: [birth year]  Age: 67 y.o. MRN: [id number]  CC:  Chief Complaint  Patient presents with   Congestive Heart Failure    Follow up    Fatigue    Very fatigued    HPI James Marsh is a 67 y.o. male with past medical history of Marsh, James Marsh, James Marsh, James Marsh.  Marsh: He was placed on Lasix 20 mg twice daily for acute Marsh, and has noticed significant improvement in his dyspnea.  He is currently taking Lasix 20 mg QD as he could not tolerate twice daily dosing later.  He is also trying to follow low-salt diet and is trying to cut down coffee intake.  Denies any leg swelling.  Denies any orthopnea or PND currently.  He reports history of James Marsh, and has had James Marsh in the past.  Last acute Marsh was in 2018.  He has been on aspirin 81 mg daily, but has denied Plavix, statin and AC in the past.  He has intermittent numbness and tingling of the face.  He currently denies any numbness or tingling.  He does not use any walking support.  He has seen neurologist at [facility] and Dr. Gerilyn Pilgrim in the past.  He prefers to avoid taking any medications.  He reports recent worsening of his chronic fatigue.  His wife reports that he has been more active lately at home - mowing grass, cleaning roof gutters, painting and other household chores.  Denies any recent change in weight or appetite, fever, chills, chronic cough, hemoptysis, night sweats or LAD.    Past Medical History:  Diagnosis Date   Acute Marsh (cerebrovascular accident) (HCC) 01/18/2017   Allergy    pollen, foods   Arthritis    Moya moya disease    Neuromuscular disorder (HCC)    Stroke [facility]     Past Surgical History:  Procedure Laterality Date   James DISC SURGERY     VASECTOMY      Family History  Problem Relation Age  of Onset   Cancer Mother 32       ovarian   Heart disease Father    Alcohol abuse Father    Stroke Father    Cancer Sister        brain   Cancer Brother        brain   Cancer Brother        brain   Heart disease Brother     Social History   Socioeconomic History   Marital status: Married    Spouse name: Not on file   Number of children: Not on file   Years of education: Not on file   Highest education level: Not on file  Occupational History   Not on file  Tobacco Use   Smoking status: Every Day    Types: Pipe    Start date: 07/25/1968   Smokeless tobacco: Never   Tobacco comments:    pipe smoker daily 3-4 times a day  Vaping Use   Vaping status: Never Used  Substance and Sexual Activity   Alcohol use: No   Drug use: No   Sexual activity: Not on file  Other Topics Concern   Not on file  Social History Narrative   Not on file   Social Determinants of Health   Financial  Resource Strain: Low Risk  (08/17/2022)   Overall Financial Resource Strain (CARDIA)    Difficulty of Paying Living Expenses: Not hard at all  Food Insecurity: No Food Insecurity (08/17/2022)   Hunger Vital Sign    Worried About Running Out of Food in the Last Year: Never true    Ran Out of Food in the Last Year: Never true  Transportation Needs: No Transportation Needs (08/17/2022)   PRAPARE - Administrator, Civil Service (Medical): No    Lack of Transportation (Non-Medical): No  Physical Activity: Sufficiently Active (08/17/2022)   Exercise Vital Sign    Days of Exercise per Week: 7 days    Minutes of Exercise per Session: 60 min  Stress: No Stress Concern Present (08/17/2022)   [employer] of Occupational Health - Occupational Stress Questionnaire    Feeling of Stress : Not at all  Social Connections: Moderately Isolated (08/17/2022)   Social Connection and Isolation Panel [NHANES]    Frequency of Communication with Friends and Family: Once a week    Frequency of Social  Gatherings with Friends and Family: More than three times a week    Attends Religious Services: Never    Database administrator or Organizations: No    Attends Banker Meetings: Never    Marital Status: Married  Catering manager Violence: Not At Risk (08/17/2022)   Humiliation, Afraid, Rape, and Kick questionnaire    Fear of Current or Ex-Partner: No    Emotionally Abused: No    Physically Abused: No    Sexually Abused: No    Outpatient Medications Prior to Visit  Medication Sig Dispense Refill   albuterol (VENTOLIN HFA) 108 (90 Base) MCG/ACT inhaler Inhale 2 puffs into the lungs every 6 (six) hours as needed for wheezing or shortness of breath. 18 g 2   aspirin 81 MG chewable tablet Chew 81 mg by mouth daily.     b complex vitamins tablet Take 1 tablet by mouth daily.     Cholecalciferol (VITAMIN D) 50 MCG (2000 UT) tablet Take 2,000 Units by mouth daily.     vitamin C (ASCORBIC ACID) 500 MG tablet Take 500 mg by mouth daily.     vitamin E 400 UNIT capsule Take 400 Units by mouth daily.     furosemide (LASIX) 20 MG tablet Take 1 tablet (20 mg total) by mouth 2 (two) times daily. (Patient taking differently: Take 20 mg by mouth daily.) 180 tablet 3   potassium chloride (KLOR-CON M) 10 MEQ tablet Take 1 tablet (10 mEq total) by mouth daily. 90 tablet 3   No facility-administered medications prior to visit.    Allergies  Allergen Reactions   Penicillins Shortness Of Breath    Has patient had a PCN reaction causing immediate rash, facial/tongue/throat swelling, SOB or lightheadedness with hypotension: yes Has patient had a PCN reaction causing severe rash involving mucus membranes or skin necrosis: no Has patient had a PCN reaction that required hospitalization: yes Has patient had a PCN reaction occurring within the last 10 years: no If all of the above answers are "NO", then may proceed with Cephalosporin use.    No [employer] Food Allergies     Patient allergic to  nuts and dairy.    ROS Review of Systems  Constitutional:  Negative for chills and fever.  HENT:  Negative for congestion and sore throat.   Eyes:  Negative for pain and discharge.  Respiratory:  Negative for cough, shortness of  breath and wheezing.   Cardiovascular:  Negative for chest pain and palpitations.  Gastrointestinal:  Negative for constipation, diarrhea, nausea and vomiting.  Endocrine: Negative for polydipsia and polyuria.  Genitourinary:  Negative for dysuria and hematuria.  Musculoskeletal:  Negative for neck pain and neck stiffness.  Skin:  Negative for rash.       Skin lesion over right knee  Neurological:  Positive for weakness. Negative for numbness.  Psychiatric/Behavioral:  Negative for agitation and behavioral problems.       Objective:    Physical Exam Vitals reviewed.  Constitutional:      General: He is not in acute distress.    Appearance: He is not diaphoretic.  HENT:     Head: Normocephalic and atraumatic.     Nose: Nose normal.     Mouth/Throat:     Mouth: Mucous membranes are moist.  Eyes:     General: No scleral icterus.    Extraocular Movements: Extraocular movements intact.  Cardiovascular:     Rate and Rhythm: Normal rate and regular rhythm.     Pulses: Normal pulses.     Heart sounds: Normal heart sounds. No murmur heard. Pulmonary:     Breath sounds: Normal breath sounds. No wheezing or rales.  Musculoskeletal:     James back: Neck supple. No tenderness.     Right lower leg: No edema.     Left lower leg: No edema.  Skin:    General: Skin is warm.     Findings: No rash.     Comments: Hyperkeratotic plaque over right knee, brownish in color  Neurological:     General: No focal deficit present.     Mental Status: He is alert and oriented to person, place, and time.  Psychiatric:        Mood and Affect: Mood normal.        Behavior: Behavior normal.     BP 127/75 (BP Location: Left Arm, Patient Position: Sitting, Cuff Size:  Normal)   Pulse 80   Ht 5\' 7"  (1.702 m)   Wt 125 lb (56.7 kg)   SpO2 98%   BMI 19.58 kg/m  Wt Readings from Last 3 Encounters:  06/01/23 125 lb (56.7 kg)  02/08/23 123 lb 9.6 oz (56.1 kg)  11/29/22 122 lb 9.6 oz (55.6 kg)    Lab Results  Component Value Date   TSH 1.700 12/02/2022   Lab Results  Component Value Date   WBC 4.0 12/02/2022   HGB 14.0 12/02/2022   HCT 41.3 12/02/2022   MCV 91 12/02/2022   PLT 180 12/02/2022   Lab Results  Component Value Date   NA 141 12/02/2022   K 4.2 12/02/2022   CO2 24 12/02/2022   GLUCOSE 81 12/02/2022   BUN 22 12/02/2022   CREATININE 1.32 (H) 12/02/2022   BILITOT 1.0 12/02/2022   ALKPHOS 80 12/02/2022   AST 18 12/02/2022   ALT 10 12/02/2022   PROT 6.7 12/02/2022   ALBUMIN 4.3 12/02/2022   CALCIUM 9.6 12/02/2022   ANIONGAP 10 07/26/2022   EGFR 59 (L) 12/02/2022   Lab Results  Component Value Date   CHOL 199 12/02/2022   Lab Results  Component Value Date   HDL 60 12/02/2022   Lab Results  Component Value Date   LDLCALC 124 (H) 12/02/2022   Lab Results  Component Value Date   TRIG 81 12/02/2022   Lab Results  Component Value Date   CHOLHDL 3.3 12/02/2022   Lab Results  Component Value Date   HGBA1C 5.1 11/30/2021      Assessment & Plan:   Problem List Items Addressed This Visit       Cardiovascular and Mediastinum   Aortic valve stenosis, moderate (Chronic)    Has moderate AS, but wants to avoid any other invasive evaluation On Lasix for volume overload      Relevant Medications   furosemide (LASIX) 20 MG tablet   Cardiomyopathy (HCC) (Chronic)    Last echocardiogram reviewed -dilated LV with global hypokinesis Denies ischemic workup Appears euvolemic currently Takes Lasix 20 mg QD      Relevant Medications   furosemide (LASIX) 20 MG tablet   Chronic congestive heart failure (HCC) - Primary    Had orthopnea and PND, -improved now with Lasix 20 mg once daily, did not tolerate BID dosing Denies  any other medicine for now, including beta-blocker, ARB or ARNI, SGLT2 inhibitor Declines ischemia workup Recent cardiology visit note and echo reviewed Check BMP and BNP      Relevant Medications   potassium chloride (KLOR-CON M) 10 MEQ tablet   furosemide (LASIX) 20 MG tablet   Other Relevant Orders   CBC   Basic Metabolic Panel (BMET)   B Nat Peptide   Magnesium     Other   History of Marsh (cerebrovascular accident) (Chronic)    On Aspirin 81 mg QD Denies statin Checked lipid profile      Chronic fatigue    His recent fatigue could be due to overexertion Check CBC, BMP, vitamin B-12 TSH and vitamin D levels were WNL in the last visit      Relevant Orders   B12   DNR (do not resuscitate)    He has brought DNR form - explained the meaning of DNR, he has good understanding and still prefers to have it in place DNR form signed in patient's presence.      Other Visit Diagnoses     Hypokalemia       Relevant Medications   potassium chloride (KLOR-CON M) 10 MEQ tablet   Refused influenza vaccine            Meds ordered this encounter  Medications   potassium chloride (KLOR-CON M) 10 MEQ tablet    Sig: Take 1 tablet (10 mEq total) by mouth daily.    Dispense:  90 tablet    Refill:  3   furosemide (LASIX) 20 MG tablet    Sig: Take 1 tablet (20 mg total) by mouth 2 (two) times daily.    Dispense:  180 tablet    Refill:  3    Follow-up: Return in about 6 months (around 11/29/2023) for Marsh.    Anabel Halon, MD

## 2023-06-01 NOTE — Assessment & Plan Note (Addendum)
Had orthopnea and PND, -improved now with Lasix 20 mg once daily, did not tolerate BID dosing Denies any other medicine for now, including beta-blocker, ARB or ARNI, SGLT2 inhibitor Declines ischemia workup Recent cardiology visit note and echo reviewed Check BMP and BNP

## 2023-06-01 NOTE — Patient Instructions (Signed)
Please continue to take medications as prescribed. ? ?Please continue to follow low salt diet and ambulate as tolerated. ?

## 2023-06-01 NOTE — Assessment & Plan Note (Addendum)
On Aspirin 81 mg QD Denies statin Checked lipid profile

## 2023-06-01 NOTE — Assessment & Plan Note (Addendum)
Last echocardiogram reviewed -dilated LV with global hypokinesis Denies ischemic workup Appears euvolemic currently Takes Lasix 20 mg QD

## 2023-06-01 NOTE — Assessment & Plan Note (Signed)
Has moderate AS, but wants to avoid any other invasive evaluation On Lasix for volume overload

## 2023-06-01 NOTE — Assessment & Plan Note (Signed)
He has brought DNR form - explained the meaning of DNR, he has good understanding and still prefers to have it in place DNR form signed in patient's presence.

## 2023-06-01 NOTE — Assessment & Plan Note (Signed)
His recent fatigue could be due to overexertion Check CBC, BMP, vitamin B-12 TSH and vitamin D levels were WNL in the last visit

## 2023-06-03 LAB — BRAIN NATRIURETIC PEPTIDE: BNP: 381.2 pg/mL — ABNORMAL HIGH (ref 0.0–100.0)

## 2023-06-03 LAB — BASIC METABOLIC PANEL
BUN/Creatinine Ratio: 14 (ref 10–24)
BUN: 16 mg/dL (ref 8–27)
CO2: 26 mmol/L (ref 20–29)
Calcium: 9.8 mg/dL (ref 8.6–10.2)
Chloride: 101 mmol/L (ref 96–106)
Creatinine, Ser: 1.15 mg/dL (ref 0.76–1.27)
Glucose: 67 mg/dL — ABNORMAL LOW (ref 70–99)
Potassium: 4.3 mmol/L (ref 3.5–5.2)
Sodium: 141 mmol/L (ref 134–144)
eGFR: 69 mL/min/{1.73_m2} (ref >59)

## 2023-06-03 LAB — MAGNESIUM: Magnesium: 2.1 mg/dL (ref 1.6–2.3)

## 2023-06-03 LAB — CBC
Hematocrit: 41.5 % (ref 37.5–51.0)
Hemoglobin: 13.6 g/dL (ref 13.0–17.7)
MCH: 30.5 pg (ref 26.6–33.0)
MCHC: 32.8 g/dL (ref 31.5–35.7)
MCV: 93 fL (ref 79–97)
Platelets: 181 10*3/uL (ref 150–450)
RBC: 4.46 x10E6/uL (ref 4.14–5.80)
RDW: 14 % (ref 11.6–15.4)
WBC: 4.1 10*3/uL (ref 3.4–10.8)

## 2023-06-03 LAB — VITAMIN B12: Vitamin B-12: 826 pg/mL (ref 232–1245)

## 2023-08-21 ENCOUNTER — Ambulatory Visit: Payer: Medicare Other | Admitting: Internal Medicine

## 2023-08-28 ENCOUNTER — Ambulatory Visit (INDEPENDENT_AMBULATORY_CARE_PROVIDER_SITE_OTHER): Payer: Medicare Other

## 2023-08-28 VITALS — Ht 65.0 in | Wt 125.0 lb

## 2023-08-28 DIAGNOSIS — Z0000 Encounter for general adult medical examination without abnormal findings: Secondary | ICD-10-CM

## 2023-08-28 NOTE — Progress Notes (Signed)
Subjective:   James Marsh is a 68 y.o. male who presents for Medicare Annual/Subsequent preventive examination.  Visit Complete: Virtual I connected with  James Marsh on 08/28/23 by a audio enabled telemedicine application and verified that I am speaking with the correct person using two identifiers.  Patient Location: Home  Provider Location: Office/Clinic  I discussed the limitations of evaluation and management by telemedicine. The patient expressed understanding and agreed to proceed.  Vital Signs: Because this visit was a virtual/telehealth visit, some criteria may be missing or patient reported. Any vitals not documented were not able to be obtained and vitals that have been documented are patient reported.  Patient Medicare AWV questionnaire was completed by the patient on 08/23/23; I have confirmed that all information answered by patient is correct and no changes since this date.  Cardiac Risk Factors include: advanced age (>25men, >41 women);male gender     Objective:    Today's Vitals   08/28/23 1005  Weight: 125 lb (56.7 kg)  Height: 5\' 5"  (1.651 m)   Body mass index is 20.8 kg/m.     08/28/2023   10:12 AM 01/18/2017   11:42 PM 01/18/2017   11:35 PM 01/18/2017    2:52 PM  Advanced Directives  Does Patient Have a Medical Advance Directive? Yes Yes Yes No  Type of Special educational needs teacher of [place];Living will Healthcare Power of Attorney   Does patient want to make changes to medical advance directive?  No - Patient declined No - Patient declined   Copy of Healthcare Power of Attorney in Chart?  No - copy requested    Would patient like information on creating a medical advance directive?   No - Patient declined     Current Medications (verified) Outpatient Encounter Medications as of 08/28/2023  Medication Sig   albuterol (VENTOLIN HFA) 108 (90 Base) MCG/ACT inhaler Inhale 2 puffs into the lungs every 6 (six) hours as needed for wheezing  or shortness of breath.   aspirin 81 MG chewable tablet Chew 81 mg by mouth daily.   b complex vitamins tablet Take 1 tablet by mouth daily.   Cholecalciferol (VITAMIN D) 50 MCG (2000 UT) tablet Take 2,000 Units by mouth daily.   furosemide (LASIX) 20 MG tablet Take 1 tablet (20 mg total) by mouth 2 (two) times daily.   potassium chloride (KLOR-CON M) 10 MEQ tablet Take 1 tablet (10 mEq total) by mouth daily.   vitamin C (ASCORBIC ACID) 500 MG tablet Take 500 mg by mouth daily.   vitamin E 400 UNIT capsule Take 400 Units by mouth daily.   No facility-administered encounter medications on file as of 08/28/2023.    Allergies (verified) Penicillins and No healthtouch food allergies   History: Past Medical History:  Diagnosis Date   Acute CVA (cerebrovascular accident) (HCC) 01/18/2017   Allergy    pollen, foods   Arthritis    Moya moya disease    Neuromuscular disorder (HCC)    Stroke [facility]    Past Surgical History:  Procedure Laterality Date   CERVICAL DISC SURGERY     VASECTOMY     Family History  Problem Relation Age of Onset   Cancer Mother 60       ovarian   Heart disease Father    Alcohol abuse Father    Stroke Father    Cancer Sister        brain   Cancer Brother  brain   Cancer Brother        brain   Heart disease Brother    Social History   Socioeconomic History   Marital status: Married    Spouse name: Not on file   Number of children: Not on file   Years of education: Not on file   Highest education level: GED or equivalent  Occupational History   Not on file  Tobacco Use   Smoking status: Every Day    Types: Pipe    Start date: 07/25/1968   Smokeless tobacco: Never   Tobacco comments:    pipe smoker daily 3-4 times a day  Vaping Use   Vaping status: Never Used  Substance and Sexual Activity   Alcohol use: No   Drug use: No   Sexual activity: Not on file  Other Topics Concern   Not on file  Social History Narrative   Not on file    Social Drivers of Health   Financial Resource Strain: Low Risk  (08/28/2023)   Overall Financial Resource Strain (CARDIA)    Difficulty of Paying Living Expenses: Not hard at all  Recent Concern: Financial Resource Strain - Medium Risk (08/23/2023)   Overall Financial Resource Strain (CARDIA)    Difficulty of Paying Living Expenses: Somewhat hard  Food Insecurity: No Food Insecurity (08/28/2023)   Hunger Vital Sign    Worried About Running Out of Food in the Last Year: Never true    Ran Out of Food in the Last Year: Never true  Transportation Needs: No Transportation Needs (08/28/2023)   PRAPARE - Administrator, Civil Service (Medical): No    Lack of Transportation (Non-Medical): No  Physical Activity: Sufficiently Active (08/28/2023)   Exercise Vital Sign    Days of Exercise per Week: 7 days    Minutes of Exercise per Session: 60 min  Stress: No Stress Concern Present (08/28/2023)   [employer] of Occupational Health - Occupational Stress Questionnaire    Feeling of Stress : Not at all  Social Connections: Moderately Isolated (08/28/2023)   Social Connection and Isolation Panel [NHANES]    Frequency of Communication with Friends and Family: More than three times a week    Frequency of Social Gatherings with Friends and Family: More than three times a week    Attends Religious Services: Never    Database administrator or Organizations: No    Attends Engineer, structural: Never    Marital Status: Married    Clinical Intake:  Pre-visit preparation completed: Yes  Pain : No/denies pain     BMI - recorded: 20.8 Nutritional Status: BMI of 19-24  Normal Diabetes: No  How often do you need to have someone help you when you read instructions, pamphlets, or other written materials from your doctor or pharmacy?: 1 - Never  Interpreter Needed?: No  Information entered by :: James Marsh, CMA   Activities of Daily Living    08/28/2023   10:13 AM 08/23/2023     9:22 AM  In your present state of health, do you have any difficulty performing the following activities:  Hearing? 0 0  Vision? 0 0  Difficulty concentrating or making decisions? 0 0  Walking or climbing stairs? 0 0  Dressing or bathing? 0 0  Doing errands, shopping? 0 0  Preparing Food and eating ? N N  Using the Toilet? N N  In the past six months, have you accidently leaked urine? N N  Do you have problems with loss of bowel control? N N  Managing your Medications? N N  Managing your Finances? N N  Housekeeping or managing your Housekeeping? N N    Patient Care Team: Anabel Halon, MD as PCP - General (Internal Medicine) Mallipeddi, Orion Modest, MD as PCP - Cardiology (Cardiology)  Indicate any recent Medical Services you may have received from other than [facility] providers in the past year (date may be approximate).     Assessment:   This is a routine wellness examination for James Marsh.  Hearing/Vision screen Hearing Screening - Comments:: Denies hearing difficulties   Vision Screening - Comments:: No concerns with vision.    Goals Addressed               This Visit's Progress     Patient Stated (pt-stated)        Patient plans to stay active - continue exercising       Depression Screen    08/28/2023   10:20 AM 08/28/2023   10:19 AM 06/01/2023    9:53 AM 11/29/2022    9:55 AM 08/17/2022    9:10 AM 08/09/2022    8:51 AM 07/20/2022    8:17 AM  PHQ 2/9 Scores  PHQ - 2 Score 0 0 0 0 0 0 0  PHQ- 9 Score 0        Exception Documentation Patient refusal Patient refusal         Fall Risk    08/28/2023   10:25 AM 08/23/2023    9:22 AM 06/01/2023    9:53 AM 11/29/2022    9:55 AM 08/09/2022    8:51 AM  Fall Risk   Falls in the past year? 1 0 0 0 0  Number falls in past yr: 0  0 0 0  Injury with Fall?   0 0 0  Risk for fall due to : History of fall(s)  No Fall Risks No Fall Risks   Follow up Falls prevention discussed  Falls evaluation completed Falls evaluation  completed     MEDICARE RISK AT HOME: Medicare Risk at Home Any stairs in or around the home?: Yes If so, are there any without handrails?: Yes Home free of loose throw rugs in walkways, pet beds, electrical cords, etc?: No Adequate lighting in your home to reduce risk of falls?: Yes Life alert?: No Use of a cane, walker or w/c?: Yes (uses for long walks for stability (gait)) Grab bars in the bathroom?: Yes Shower chair or bench in shower?: No Elevated toilet seat or a handicapped toilet?: No  TIMED UP AND GO:  Was the test performed?  No    Cognitive Function:        08/28/2023   10:28 AM  6CIT Screen  What Year? 0 points  What month? 0 points  What time? 0 points  Count back from 20 0 points  Months in reverse 0 points  Repeat phrase 0 points  Total Score 0 points    Immunizations Immunization History  Administered Date(s) Administered   Tdap 11/29/2022    TDAP status: Up to date  Flu Vaccine status: Declined, Education has been provided regarding the importance of this vaccine but patient still declined. Advised may receive this vaccine at local pharmacy or Health Dept. Aware to provide a copy of the vaccination record if obtained from local pharmacy or Health Dept. Verbalized acceptance and understanding.  Pneumococcal vaccine status: Declined,  Education has been provided regarding the importance  of this vaccine but patient still declined. Advised may receive this vaccine at local pharmacy or Health Dept. Aware to provide a copy of the vaccination record if obtained from local pharmacy or Health Dept. Verbalized acceptance and understanding.   Covid-19 vaccine status: Declined, Education has been provided regarding the importance of this vaccine but patient still declined. Advised may receive this vaccine at local pharmacy or Health Dept.or vaccine clinic. Aware to provide a copy of the vaccination record if obtained from local pharmacy or Health Dept. Verbalized  acceptance and understanding.  Qualifies for Shingles Vaccine? No   Zostavax completed No   Shingrix Completed?: No.    Education has been provided regarding the importance of this vaccine. Patient has been advised to call insurance company to determine out of pocket expense if they have not yet received this vaccine. Advised may also receive vaccine at local pharmacy or Health Dept. Verbalized acceptance and understanding.  Screening Tests Health Maintenance  Topic Date Due   COVID-19 Vaccine (1) Never done   Pneumonia Vaccine 23+ Years old (1 of 2 - PCV) Never done   Colonoscopy  Never done   Zoster Vaccines- Shingrix (1 of 2) 09/01/2023 (Originally 04/15/1975)   INFLUENZA VACCINE  10/23/2023 (Originally 02/23/2023)   Medicare Annual Wellness (AWV)  08/27/2024   DTaP/Tdap/Td (2 - Td or Tdap) 11/28/2032   Hepatitis C Screening  Completed   HPV VACCINES  Aged Out    Health Maintenance  Health Maintenance Due  Topic Date Due   COVID-19 Vaccine (1) Never done   Pneumonia Vaccine 90+ Years old (1 of 2 - PCV) Never done   Colonoscopy  Never done   Patient Declined having a Colonoscopy.  Stated has discussed with the Provider.  Advised to call if decide and get referral.  Additional Screening:  Hepatitis C Screening: does qualify; Completed 12/02/22  Vision Screening: Recommended annual ophthalmology exams for early detection of glaucoma and other disorders of the eye. Is the patient up to date with their annual eye exam?  No does not wear eyeglasses. Declines referral. Who is the provider or what is the name of the office in which the patient attends annual eye exams? no If pt is not established with a provider, would they like to be referred to a provider to establish care? No .   Dental Screening: Recommended annual dental exams for proper oral hygiene  Community Resource Referral / Chronic Care Management: CRR required this visit?  No   CCM required this visit?  No      Plan:     I have personally reviewed and noted the following in the patient's chart:   Medical and social history Use of alcohol, tobacco or illicit drugs  Current medications and supplements including opioid prescriptions. Patient is not currently taking opioid prescriptions. Functional ability and status Nutritional status Physical activity Advanced directives List of other physicians Hospitalizations, surgeries, and ER visits in previous 12 months Vitals Screenings to include cognitive, depression, and falls Referrals and appointments  In addition, I have reviewed and discussed with patient certain preventive protocols, quality metrics, and best practice recommendations. A written personalized care plan for preventive services as well as general preventive health recommendations were provided to patient.     Darreld Mclean, CMA   08/28/2023   After Visit Summary: (MyChart) Due to this being a telephonic visit, the after visit summary with patients personalized plan was offered to patient via MyChart   Nurse Notes: none

## 2023-08-28 NOTE — Patient Instructions (Addendum)
James Marsh , Thank you for taking time to come for your Medicare Wellness Visit. I appreciate your ongoing commitment to your health goals. Please review the following plan we discussed and let me know if I can assist you in the future.   Referrals/Orders/Follow-Ups/Clinician Recommendations: Aim for 30 minutes of exercise or brisk walking, 6-8 glasses of water, and 5 servings of fruits and vegetables each day.   Next Annual Wellness Visit - 08/28/2024 at 10:00am  This is a list of the screening recommended for you and due dates:  Health Maintenance  Topic Date Due   COVID-19 Vaccine (1) Never done   Pneumonia Vaccine (1 of 2 - PCV) Never done   Colon Cancer Screening  Never done   Zoster (Shingles) Vaccine (1 of 2) 09/01/2023*   Flu Shot  10/23/2023*   Medicare Annual Wellness Visit  08/27/2024   DTaP/Tdap/Td vaccine (2 - Td or Tdap) 11/28/2032   Hepatitis C Screening  Completed   HPV Vaccine  Aged Out  *Topic was postponed. The date shown is not the original due date.    Advanced directives: (In Chart) A copy of your advanced directives are scanned into your chart should your provider ever need it.  Next Medicare Annual Wellness Visit scheduled for next year: Yes 08/28/2024

## 2023-09-11 ENCOUNTER — Ambulatory Visit: Payer: Medicare Other

## 2023-10-10 ENCOUNTER — Telehealth: Payer: Self-pay

## 2023-10-10 NOTE — Telephone Encounter (Signed)
 Copied from CRM [id number]. Topic: General - Deceased Patient >> November 08, 2023  3:58 PM Shon Hale wrote: Name of caller: Lupita Leash   Date of death: November 06, 2023   Name of funeral home: [facility]  Phone number of funeral home: [phone]  Provider that needs to sign form: Dr. Allena Katz  Timeline for signing: Cremation, needing signature as soon as possible.   Certificate sent through [facility] Case ID #: [id number]

## 2023-10-24 DEATH — deceased

## 2023-11-29 ENCOUNTER — Ambulatory Visit: Payer: Medicare Other | Admitting: Internal Medicine

## 2023-12-06 ENCOUNTER — Ambulatory Visit: Payer: Medicare Other | Admitting: Internal Medicine

## 2024-08-28 ENCOUNTER — Encounter: Payer: Medicare Other | Admitting: Internal Medicine
